# Patient Record
Sex: Female | Born: 1990
Health system: Southern US, Community
[De-identification: ages and names within clinical notes are randomized; demographics above are authoritative.]

## PROBLEM LIST (undated history)

## (undated) DIAGNOSIS — O26649 Intrahepatic cholestasis of pregnancy, unspecified trimester: Secondary | ICD-10-CM

## (undated) DIAGNOSIS — K8020 Calculus of gallbladder without cholecystitis without obstruction: Secondary | ICD-10-CM

## (undated) DIAGNOSIS — K831 Obstruction of bile duct: Secondary | ICD-10-CM

## (undated) DIAGNOSIS — O26619 Liver and biliary tract disorders in pregnancy, unspecified trimester: Secondary | ICD-10-CM

## (undated) HISTORY — PX: WISDOM TOOTH EXTRACTION: SHX21

## (undated) HISTORY — PX: TONSILLECTOMY: SUR1361

---

## 2015-02-22 NOTE — L&D Delivery Note (Signed)
Delivery Note At 4:41 AM a healthy female was delivered via Vaginal, Spontaneous Delivery (Presentation: Ant Occiput).  APGAR: 9, 9; weight pending.   Placenta status: complete, 3 Vs.  Cord:  with the following complications: none.  Cord pH: N/A  Anesthesia:  Epidural Episiotomy: None Lacerations: 2nd degree perineal Suture Repair: 2.0 3.0 vicryl vicryl rapide Est. Blood Loss (mL):    Mom to postpartum.  Baby to Couplet care / Skin to Skin. Circ consent obtained, will do Wednesday am.  Jocelynne Duquette,MARIE-LYNE 11/16/2015, 5:06 AM

## 2015-05-18 LAB — OB RESULTS CONSOLE HIV ANTIBODY (ROUTINE TESTING): HIV: NONREACTIVE

## 2015-05-18 LAB — OB RESULTS CONSOLE ABO/RH: RH TYPE: POSITIVE

## 2015-05-18 LAB — OB RESULTS CONSOLE RPR: RPR: NONREACTIVE

## 2015-05-18 LAB — OB RESULTS CONSOLE ANTIBODY SCREEN: ANTIBODY SCREEN: NEGATIVE

## 2015-05-18 LAB — OB RESULTS CONSOLE GC/CHLAMYDIA
Chlamydia: NEGATIVE
GC PROBE AMP, GENITAL: NEGATIVE

## 2015-05-18 LAB — OB RESULTS CONSOLE HEPATITIS B SURFACE ANTIGEN: Hepatitis B Surface Ag: NEGATIVE

## 2015-05-18 LAB — OB RESULTS CONSOLE RUBELLA ANTIBODY, IGM: RUBELLA: IMMUNE

## 2015-05-28 ENCOUNTER — Inpatient Hospital Stay (HOSPITAL_COMMUNITY): Admit: 2015-05-28 | Payer: Self-pay | Admitting: Obstetrics and Gynecology

## 2015-10-20 ENCOUNTER — Other Ambulatory Visit: Payer: Self-pay | Admitting: Obstetrics & Gynecology

## 2015-10-20 ENCOUNTER — Other Ambulatory Visit (HOSPITAL_COMMUNITY): Payer: Self-pay | Admitting: Obstetrics & Gynecology

## 2015-10-20 DIAGNOSIS — K831 Obstruction of bile duct: Secondary | ICD-10-CM

## 2015-10-20 DIAGNOSIS — R740 Nonspecific elevation of levels of transaminase and lactic acid dehydrogenase [LDH]: Secondary | ICD-10-CM

## 2015-10-20 DIAGNOSIS — O26643 Intrahepatic cholestasis of pregnancy, third trimester: Secondary | ICD-10-CM

## 2015-10-20 DIAGNOSIS — O26613 Liver and biliary tract disorders in pregnancy, third trimester: Principal | ICD-10-CM

## 2015-10-20 DIAGNOSIS — R7401 Elevation of levels of liver transaminase levels: Secondary | ICD-10-CM

## 2015-10-21 ENCOUNTER — Ambulatory Visit (HOSPITAL_COMMUNITY)
Admission: RE | Admit: 2015-10-21 | Discharge: 2015-10-21 | Disposition: A | Discharge status: Home / Self Care | Payer: Federal, State, Local not specified - PPO | Source: Ambulatory Visit | Attending: Obstetrics & Gynecology | Admitting: Obstetrics & Gynecology

## 2015-10-21 DIAGNOSIS — Z3A00 Weeks of gestation of pregnancy not specified: Secondary | ICD-10-CM | POA: Diagnosis not present

## 2015-10-21 DIAGNOSIS — O26613 Liver and biliary tract disorders in pregnancy, third trimester: Secondary | ICD-10-CM | POA: Diagnosis present

## 2015-10-21 DIAGNOSIS — K831 Obstruction of bile duct: Secondary | ICD-10-CM | POA: Diagnosis present

## 2015-10-28 ENCOUNTER — Encounter (HOSPITAL_COMMUNITY): Payer: Self-pay

## 2015-10-28 ENCOUNTER — Inpatient Hospital Stay (HOSPITAL_COMMUNITY): Payer: Federal, State, Local not specified - PPO

## 2015-10-28 ENCOUNTER — Observation Stay (HOSPITAL_COMMUNITY)
Admission: AD | Admit: 2015-10-28 | Discharge: 2015-10-28 | Disposition: A | Discharge status: Home / Self Care | Payer: Federal, State, Local not specified - PPO | Source: Ambulatory Visit | Attending: Obstetrics | Admitting: Obstetrics

## 2015-10-28 ENCOUNTER — Ambulatory Visit (HOSPITAL_COMMUNITY)
Admission: RE | Admit: 2015-10-28 | Discharge: 2015-10-28 | Disposition: A | Discharge status: Home / Self Care | Payer: Federal, State, Local not specified - PPO | Source: Ambulatory Visit | Attending: Obstetrics | Admitting: Obstetrics

## 2015-10-28 DIAGNOSIS — Z3A34 34 weeks gestation of pregnancy: Secondary | ICD-10-CM | POA: Insufficient documentation

## 2015-10-28 DIAGNOSIS — K831 Obstruction of bile duct: Secondary | ICD-10-CM

## 2015-10-28 DIAGNOSIS — R748 Abnormal levels of other serum enzymes: Secondary | ICD-10-CM

## 2015-10-28 DIAGNOSIS — O26613 Liver and biliary tract disorders in pregnancy, third trimester: Principal | ICD-10-CM

## 2015-10-28 LAB — CBC WITH DIFFERENTIAL/PLATELET
BASOS ABS: 0 10*3/uL (ref 0.0–0.1)
BASOS PCT: 0 %
Eosinophils Absolute: 0 10*3/uL (ref 0.0–0.7)
Eosinophils Relative: 0 %
HEMATOCRIT: 36.3 % (ref 36.0–46.0)
Hemoglobin: 12.8 g/dL (ref 12.0–15.0)
LYMPHS PCT: 9 %
Lymphs Abs: 0.9 10*3/uL (ref 0.7–4.0)
MCH: 27.5 pg (ref 26.0–34.0)
MCHC: 35.3 g/dL (ref 30.0–36.0)
MCV: 78.1 fL (ref 78.0–100.0)
MONO ABS: 0.1 10*3/uL (ref 0.1–1.0)
Monocytes Relative: 1 %
NEUTROS ABS: 9.4 10*3/uL — AB (ref 1.7–7.7)
Neutrophils Relative %: 90 %
PLATELETS: 221 10*3/uL (ref 150–400)
RBC: 4.65 MIL/uL (ref 3.87–5.11)
RDW: 14 % (ref 11.5–15.5)
WBC: 10.5 10*3/uL (ref 4.0–10.5)

## 2015-10-28 LAB — LIPASE, BLOOD: LIPASE: 25 U/L (ref 11–51)

## 2015-10-28 LAB — COMPREHENSIVE METABOLIC PANEL
ALBUMIN: 3.3 g/dL — AB (ref 3.5–5.0)
ALK PHOS: 174 U/L — AB (ref 38–126)
ALT: 232 U/L — ABNORMAL HIGH (ref 14–54)
ANION GAP: 9 (ref 5–15)
AST: 104 U/L — ABNORMAL HIGH (ref 15–41)
BILIRUBIN TOTAL: 0.7 mg/dL (ref 0.3–1.2)
BUN: 10 mg/dL (ref 6–20)
CALCIUM: 9.1 mg/dL (ref 8.9–10.3)
CO2: 18 mmol/L — ABNORMAL LOW (ref 22–32)
Chloride: 108 mmol/L (ref 101–111)
Creatinine, Ser: 0.6 mg/dL (ref 0.44–1.00)
Glucose, Bld: 91 mg/dL (ref 65–99)
POTASSIUM: 4 mmol/L (ref 3.5–5.1)
Sodium: 135 mmol/L (ref 135–145)
TOTAL PROTEIN: 7.1 g/dL (ref 6.5–8.1)

## 2015-10-28 LAB — OB RESULTS CONSOLE GBS: GBS: NEGATIVE

## 2015-10-28 LAB — URIC ACID: Uric Acid, Serum: 3.9 mg/dL (ref 2.3–6.6)

## 2015-10-28 LAB — TYPE AND SCREEN
ABO/RH(D): A POS
ANTIBODY SCREEN: NEGATIVE

## 2015-10-28 LAB — PROTIME-INR
INR: 0.88
Prothrombin Time: 11.9 seconds (ref 11.4–15.2)

## 2015-10-28 LAB — ABO/RH: ABO/RH(D): A POS

## 2015-10-28 MED ORDER — LACTATED RINGERS IV BOLUS (SEPSIS)
500.0000 mL | Freq: Once | INTRAVENOUS | Status: AC
  Administered 2015-10-28: 500 mL via INTRAVENOUS

## 2015-10-28 MED ORDER — ACETAMINOPHEN 325 MG PO TABS: 650.0000 mg | ORAL_TABLET | ORAL | Status: DC | PRN

## 2015-10-28 MED ORDER — DOCUSATE SODIUM 100 MG PO CAPS: 100.0000 mg | ORAL_CAPSULE | Freq: Every day | ORAL | Status: DC

## 2015-10-28 MED ORDER — URSODIOL 300 MG PO CAPS
300.0000 mg | ORAL_CAPSULE | Freq: Two times a day (BID) | ORAL | Status: DC
  Administered 2015-10-28: 300 mg via ORAL
  Filled 2015-10-28 (×3): qty 1

## 2015-10-28 MED ORDER — CALCIUM CARBONATE ANTACID 500 MG PO CHEW: 2.0000 | CHEWABLE_TABLET | ORAL | Status: DC | PRN

## 2015-10-28 MED ORDER — PRENATAL MULTIVITAMIN CH: 1.0000 | ORAL_TABLET | Freq: Every day | ORAL | Status: DC

## 2015-10-28 MED ORDER — URSODIOL 300 MG PO CAPS: 300.0000 mg | ORAL_CAPSULE | Freq: Two times a day (BID) | ORAL | 1 refills | Status: DC

## 2015-10-28 MED ORDER — ZOLPIDEM TARTRATE 5 MG PO TABS: 5.0000 mg | ORAL_TABLET | Freq: Every evening | ORAL | Status: DC | PRN

## 2015-10-28 NOTE — Progress Notes (Signed)
MFM consult, staff note:  I spoke with Stefanie Smith.  Intrahepatic Smith of pregnancy (ICP) affects 0.7% of white pregnant women, approximately twice as many [name] women, and up to 5% of [name] women. The recurrence rate of ICP varies from 60% to 90% in different populations.  Accordingly, she had a prior pregnancy affected by Smith of pregnancy.    Fetal complications that occur more commonly in ICP pregnancies include preterm labor, fetal asphyxial events, meconium staining of amniotic fluid, and intrauterine death. Three studies have demonstrated that ICP patients with higher maternal serum bile acid levels (>40 mol/L in two studies) more commonly have pregnancies complicated by meconium-stained liquor and fetal asphyxial events, and the largest study also demonstrated that patients with higher levels of bile acids had higher rates of spontaneous preterm labor.   ICP should be also in pregnant women with pruritus but without a rash and serum bile acid >10, noting this patient's bile acids were 21.569micromol/l. The pruritus is commonly generalized or affects the palms and soles, but it can occur on any part of the body. There is no consensus about the most reliable biochemical test for diagnosing ICP. Periotic measuring levels of liver transaminases with serum bile acids is recommended. Although ICP often identified during late pregnancy, early recurrent as also been reported. Smith may also occur in conjunction with other liver diseases. It is advisable to perform a liver ultrasound scan to exclude biliary obstruction. Affected women commonly have gallstones, however, the gallstones are unlikely to be the cause of the Smith unless the woman has symptoms of biliary obstruction. Other conditions that can be associated with ICP are hepatitis C, autoimmune hepatitis, and primary biliary cirrhosis. These conditions have important  implications for the subsequent health of the mother, and it is therefore advisable to screen for them.  Ursodeoxycholic acid (UDCA) is the only drug that has consistently been shown to improve the maternal symptoms and biochemical features of ICP. There have been several reports about the efficacy of UDCA in ICP, but there have been few randomized, controlled trials. The largest trial showed that UDCA reduced levels of pruritus, liver transaminases, and bilirubin compared with dexamethasone or placebo and that it was particularly effective in women with serum levels of bile acids higher than 40 mol/L. UDCA is usually started at a dose of 300 mg twice daily, and the dose may be increased further.  A variety of other drugs have been proposed as treatments for ICP, including dexamethasone, S-adenosyl methionine, cholestyramine, and guar gum, but there is less evidence for their efficacy than there is for UDCA.  No treatments have been shown to reduce fetal risks associated with ICP. However, it is likely that treatments that reduce levels of maternal bile acids also reduce fetal risk because of the data that implicate bile acids in pregnancies complicated by spontaneous preterm delivery, fetal asphyxial events, and meconium-stained amniotic fluid. None of the UDCA trials has been powered to investigate whether the drug protects the fetus. However, it is known that UDCA treatment improves the serum bile acid levels measured in cord blood and amniotic fluid at the time of delivery.  The only forms of fetal surveillance that have been shown to predict which fetuses may be at risk are amniocentesis and amnioscopy for meconium. However, such an approach is likely to be considered too intrusive to be used routinely by most obstetricians. Many obstetric units review women with ICP several times per week for fetal  assessment by electronic fetal monitoring and/or biophysical profile, or both.  I recommend twice weekly  NST, weekly AFI, monthly interval growth, and regular prenatal visits with plan for delivery at 37 weeks.  Kick counts in the interim.  She had a negative viral hepatitis panel and essentially normal RUQ ultrasound for pregnancy.  Given her AST of 285, ALT of 521, I was concerned that she could have an evolving AFLP (acute fatty liver of pregnancy).  However, upon admission today, her serum glucose is normal and she has no jaundice/icterus, effectively ruling out AFLP.  Additionally, her LFTs have improved now at AST 104 and ALT 232 with glucose 91 and Cr 0.6.  It is possible she may have some underlying autoimmune hepatitis, and I would recommend outpatient workup with GI medicine but this is not emergent.    Recommendations: 1. Ursodiol 300mg  po bid (would continue until ~1wk postpartum as the bile acids may remain elevated and create pruritus for several days postpartum 2. NST 2x/wk 3. AFI weekly  4. Interval growth by u/s monthly 5. Consider outpatient GI medicine referral to r/o autoimmune hepatitis if they feel it's necessary noting I am less concerned given the improvement but wish to maintain the recommendation given that it is atypical to have LFT's of 285/521 with Smith of pregnancy without some underlying comorbid predisposition (noting I have seen this presentation previously and the patient did have both Smith and had a positive w/u for autoimmune hepatitis). 6. Delivery at 36-37 weeks 7. Close surveillance for development of preeclampsia (increased with Smith) 8. May use benadryl or hydroxyzine for itching as well   Greater than 50% of the 40 minute visit was spent in counseling/coordination of care for the patient regarding above.   I saw your patient in consultation.  Please see attached impression and recommendations.  Thank you,  Louann Sjogren Gaynelle Arabian, Louann Sjogren, MD, MS, FACOG Assistant Professor Section of Maternal-Fetal Medicine [facility]

## 2015-10-28 NOTE — Progress Notes (Signed)
Notified MD that patient has arrived, received orders.  Called lab to pull stat lab orders.

## 2015-10-28 NOTE — H&P (Signed)
Stefanie FreshwaterMichelle Smith is a 25 y.o. G2P1 at 1334 wks presenting for further evaluation given elevated LFTs. Patient was seen in the office today for routine OB visit. Patient was noting continued itching. Review of patient's chart revealed a diagnosis of cholestasis of pregnancy as she had had in her prior pregnancy. This was diagnosed on 10/02/2015. At that time her bile acid salts were 15.8 her AST was 58 and her ALT was 107. She had no additional symptoms of preeclampsia. She was seen back in the office on 8/25 with an ultrasound showing fetal growth at the 83rd percentile in the vertex position. Her AST was 200 her ALT was 404 and her bile acid salts was 29.9. Again she had no symptoms of preeclampsia.. Hepatitis workup was negative Patient had a right upper quadrant ultrasound on 8/30 which was normal other than some mild gallbladder wall thickening. She was seen back in the office on 8/31 and had a biophysical profile of 8 out of 8. Repeat labs on 8/31 revealed AST of 285 and ALT of 521. Upon her visit today these labs were noted. She was thus sent to the hospital after sending a vaginal culture for group B strep and administering a single dose of betamethasone. Pt notes no contractions . Good fetal movement, No vaginal bleeding, no leaking fluid. No headache or visual change. No right upper quadrant pain. No heartburn or emesis. Patient notes continued itching. She has not been placed on any medications for her itching.  [facility] - History of cholestasis with induction of labor 37 weeks in her prior pregnancy. Repeat diagnosis of cholestasis in this pregnancy. As noted above. - Posterior placenta previa which resolved to low-lying which are not resolved completely   Prenatal Transfer Tool  Maternal Diabetes: No Genetic Screening: Declined Maternal Ultrasounds/Referrals: Normal Fetal Ultrasounds or other Referrals:  None Maternal Substance Abuse:  No Significant Maternal  Medications:  None Significant Maternal Lab Results: Lab values include: Other:  elevated LFTs, elevated bile acid salts     OB History    Gravida Para Term Preterm AB Living   2 1       1    SAB TAB Ectopic Multiple Live Births                 History reviewed. No pertinent past medical history. History reviewed. No pertinent surgical history. Family History: family history is not on file. Social History:  has no tobacco, alcohol, and drug history on file.  Review of Systems - Negative except Itching     Blood pressure 122/73, pulse (!) 103, temperature 98.1 F (36.7 C), temperature source Oral, resp. rate 20, height 5\' 6"  (1.676 m), weight 83.5 kg (184 lb), SpO2 99 %.  Physical Exam:  Gen: well appearing, no distress CV: RRR Pulm: CTAB Back: no CVAT Abd: gravid, NT, no RUQ pain LE: No edema, equal bilaterally, non-tender. 2+ DTRs, no clonus Toco: None FH: baseline 140s, accelerations present, no deceleratons, 10 beat variability  Prenatal labs: ABO, Rh: --/--/A POS, A POS (09/06 1328) Antibody: NEG (09/06 1328) Rubella: !Error! Immune RPR:   nonreactive HBsAg:   negative HIV:   negative GBS:   not yet resulted 1 hr Glucola 113  Genetic screening declined, normal AFP Anatomy US normal  CBC    Component Value Date/Time   WBC 10.5 10/28/2015 1328   RBC 4.65 10/28/2015 1328   HGB 12.8 10/28/2015 1328   HCT 36.3 10/28/2015 1328  PLT 221 10/28/2015 1328   MCV 78.1 10/28/2015 1328   MCH 27.5 10/28/2015 1328   MCHC 35.3 10/28/2015 1328   RDW 14.0 10/28/2015 1328   LYMPHSABS 0.9 10/28/2015 1328   MONOABS 0.1 10/28/2015 1328   EOSABS 0.0 10/28/2015 1328   BASOSABS 0.0 10/28/2015 1328    CMP     Component Value Date/Time   NA 135 10/28/2015 1328   K 4.0 10/28/2015 1328   CL 108 10/28/2015 1328   CO2 18 (L) 10/28/2015 1328   GLUCOSE 91 10/28/2015 1328   BUN 10 10/28/2015 1328   CREATININE 0.60 10/28/2015 1328   CALCIUM 9.1 10/28/2015 1328   PROT 7.1  10/28/2015 1328   ALBUMIN 3.3 (L) 10/28/2015 1328   AST 104 (H) 10/28/2015 1328   ALT 232 (H) 10/28/2015 1328   ALKPHOS 174 (H) 10/28/2015 1328   BILITOT 0.7 10/28/2015 1328   GFRNONAA >60 10/28/2015 1328   GFRAA >60 10/28/2015 1328      Assessment/Plan: 25 y.o. G2P1 at 34 weeks with cholestasis of pregnancy and elevated LFTs Cholestasis of pregnancy. Will start on ursodiol. Symptomatic control as needed. Will plan twice weekly fetal evaluation, growth ultrasound every 3 weeks and plan for delivery at 36-37 weeks. Patient is aware of the risks of cholestasis of pregnancy and will continue close evaluation for symptoms of preeclampsia. Will recommend daily fetal kick counts - Elevated LFTs. LFT elevation is higher than would be expected for cholestasis alone. Unclear etiology but LFTs are trending back down. Right upper quadrant ultrasound has been negative on August 30. Patient is outpatient follow-up with GI. Appreciate their input and any additional testing. No evidence that this is from acute fatty liver of pregnancy or preeclampsia at this time. -GBS pending -Fetal well being. Reassuring testing at this point. Continue outpatient evaluation. Given plan for early delivery patient has received single dose of betamethasone and will have a second dose of betamethasone tomorrow.  Stefanie Smith A. 10/28/2015 5:56 PM      Stefanie Smith A. 10/28/2015, 5:45 PM

## 2015-10-28 NOTE — Progress Notes (Signed)
[facility]

## 2015-10-28 NOTE — Progress Notes (Signed)
Dr. Ernestina PennaFogleman at bedside, aware of contraction pattern.  Bolus ordered, sterile exam performed.   Pt. Has GI appointment in morning.

## 2015-10-29 ENCOUNTER — Other Ambulatory Visit (HOSPITAL_COMMUNITY): Payer: Self-pay

## 2015-10-29 ENCOUNTER — Encounter (HOSPITAL_COMMUNITY): Payer: Self-pay

## 2015-11-01 NOTE — Discharge Summary (Signed)
Patient ID: Stefanie FreshwaterMichelle Smith MRN: [id number] DOB/AGE: 27/05/1990 27 y.o.  Admit date: 10/28/2015 Discharge date: 10/28/15  Admission Diagnoses: 34wks elevated liver enzyme  Discharge Diagnoses: 34wks elevated liver enzyme         Discharged Condition: stable  Hospital Course: Pt admitted for further evaluation after office labs revealed very high LTFs in the setting of cholestasis of pregnancy Pt has serial bp's, fetal monitoring, MFM consult and repeat labs. She remained asymptomatic. Repeat LFTs were much lower, bp stable, serum glucose normal and reactive fetal testing. She was started or ursodiol and d/c home with GI follow up the day after d/c and office f/u the day after d/c for BMZ #2.   Consults: MFM  Treatments: IV hydration and ursodiol  Disposition: home     Medication List    TAKE these medications   HYDROCORTISONE EX Apply 1 application topically as needed (itching).   prenatal multivitamin Tabs tablet Take 1 tablet by mouth daily at 12 noon.   ursodiol 300 MG capsule Commonly known as:  ACTIGALL Take 1 capsule (300 mg total) by mouth 2 (two) times daily.        Signed: Lendon ColonelFOGLEMAN,Laurinda Carreno A., MD MD 11/01/2015, 9:42 PM

## 2015-11-06 ENCOUNTER — Encounter (HOSPITAL_COMMUNITY): Payer: Self-pay | Admitting: *Deleted

## 2015-11-06 ENCOUNTER — Inpatient Hospital Stay (HOSPITAL_COMMUNITY): Payer: Federal, State, Local not specified - PPO

## 2015-11-06 ENCOUNTER — Inpatient Hospital Stay (HOSPITAL_COMMUNITY)
Admission: AD | Admit: 2015-11-06 | Discharge: 2015-11-06 | Disposition: A | Discharge status: Home / Self Care | Payer: Federal, State, Local not specified - PPO | Source: Ambulatory Visit | Attending: Obstetrics and Gynecology | Admitting: Obstetrics and Gynecology

## 2015-11-06 DIAGNOSIS — Z3A35 35 weeks gestation of pregnancy: Secondary | ICD-10-CM | POA: Diagnosis not present

## 2015-11-06 DIAGNOSIS — O26613 Liver and biliary tract disorders in pregnancy, third trimester: Secondary | ICD-10-CM | POA: Insufficient documentation

## 2015-11-06 HISTORY — DX: Calculus of gallbladder without cholecystitis without obstruction: K80.20

## 2015-11-06 NOTE — MAU Note (Signed)
Pt states she was sent over for U/S from Dr Sharol RousselLavoie's office today. Denies any vaginal bleeding or LOF.

## 2015-11-06 NOTE — Discharge Instructions (Signed)
Fetal Movement Counts  Patient Name: __________________________________________________ Patient Due Date: ____________________  Performing a fetal movement count is highly recommended in high-risk pregnancies, but it is good for every pregnant woman to do. Your health care provider may ask you to start counting fetal movements at 28 weeks of the pregnancy. Fetal movements often increase:  · After eating a full meal.  · After physical activity.  · After eating or drinking something sweet or cold.  · At rest.  Pay attention to when you feel the baby is most active. This will help you notice a pattern of your baby's sleep and wake cycles and what factors contribute to an increase in fetal movement. It is important to perform a fetal movement count at the same time each day when your baby is normally most active.   HOW TO COUNT FETAL MOVEMENTS  1. Find a quiet and comfortable area to sit or lie down on your left side. Lying on your left side provides the best blood and oxygen circulation to your baby.  2. Write down the day and time on a sheet of paper or in a journal.  3. Start counting kicks, flutters, swishes, rolls, or jabs in a 2-hour period. You should feel at least 10 movements within 2 hours.  4. If you do not feel 10 movements in 2 hours, wait 2-3 hours and count again. Look for a change in the pattern or not enough counts in 2 hours.  SEEK MEDICAL CARE IF:  · You feel less than 10 counts in 2 hours, tried twice.  · There is no movement in over an hour.  · The pattern is changing or taking longer each day to reach 10 counts in 2 hours.  · You feel the baby is not moving as he or she usually does.  Date: ____________ Movements: ____________ Start time: ____________ Finish time: ____________   Date: ____________ Movements: ____________ Start time: ____________ Finish time: ____________  Date: ____________ Movements: ____________ Start time: ____________ Finish time: ____________  Date: ____________ Movements:  ____________ Start time: ____________ Finish time: ____________  Date: ____________ Movements: ____________ Start time: ____________ Finish time: ____________  Date: ____________ Movements: ____________ Start time: ____________ Finish time: ____________  Date: ____________ Movements: ____________ Start time: ____________ Finish time: ____________  Date: ____________ Movements: ____________ Start time: ____________ Finish time: ____________   Date: ____________ Movements: ____________ Start time: ____________ Finish time: ____________  Date: ____________ Movements: ____________ Start time: ____________ Finish time: ____________  Date: ____________ Movements: ____________ Start time: ____________ Finish time: ____________  Date: ____________ Movements: ____________ Start time: ____________ Finish time: ____________  Date: ____________ Movements: ____________ Start time: ____________ Finish time: ____________  Date: ____________ Movements: ____________ Start time: ____________ Finish time: ____________  Date: ____________ Movements: ____________ Start time: ____________ Finish time: ____________   Date: ____________ Movements: ____________ Start time: ____________ Finish time: ____________  Date: ____________ Movements: ____________ Start time: ____________ Finish time: ____________  Date: ____________ Movements: ____________ Start time: ____________ Finish time: ____________  Date: ____________ Movements: ____________ Start time: ____________ Finish time: ____________  Date: ____________ Movements: ____________ Start time: ____________ Finish time: ____________  Date: ____________ Movements: ____________ Start time: ____________ Finish time: ____________  Date: ____________ Movements: ____________ Start time: ____________ Finish time: ____________   Date: ____________ Movements: ____________ Start time: ____________ Finish time: ____________  Date: ____________ Movements: ____________ Start time: ____________ Finish  time: ____________  Date: ____________ Movements: ____________ Start time: ____________ Finish time: ____________  Date: ____________ Movements: ____________ Start time:   ____________ Finish time: ____________  Date: ____________ Movements: ____________ Start time: ____________ Finish time: ____________  Date: ____________ Movements: ____________ Start time: ____________ Finish time: ____________  Date: ____________ Movements: ____________ Start time: ____________ Finish time: ____________   Date: ____________ Movements: ____________ Start time: ____________ Finish time: ____________  Date: ____________ Movements: ____________ Start time: ____________ Finish time: ____________  Date: ____________ Movements: ____________ Start time: ____________ Finish time: ____________  Date: ____________ Movements: ____________ Start time: ____________ Finish time: ____________  Date: ____________ Movements: ____________ Start time: ____________ Finish time: ____________  Date: ____________ Movements: ____________ Start time: ____________ Finish time: ____________  Date: ____________ Movements: ____________ Start time: ____________ Finish time: ____________   Date: ____________ Movements: ____________ Start time: ____________ Finish time: ____________  Date: ____________ Movements: ____________ Start time: ____________ Finish time: ____________  Date: ____________ Movements: ____________ Start time: ____________ Finish time: ____________  Date: ____________ Movements: ____________ Start time: ____________ Finish time: ____________  Date: ____________ Movements: ____________ Start time: ____________ Finish time: ____________  Date: ____________ Movements: ____________ Start time: ____________ Finish time: ____________  Date: ____________ Movements: ____________ Start time: ____________ Finish time: ____________   Date: ____________ Movements: ____________ Start time: ____________ Finish time: ____________  Date: ____________  Movements: ____________ Start time: ____________ Finish time: ____________  Date: ____________ Movements: ____________ Start time: ____________ Finish time: ____________  Date: ____________ Movements: ____________ Start time: ____________ Finish time: ____________  Date: ____________ Movements: ____________ Start time: ____________ Finish time: ____________  Date: ____________ Movements: ____________ Start time: ____________ Finish time: ____________  Date: ____________ Movements: ____________ Start time: ____________ Finish time: ____________   Date: ____________ Movements: ____________ Start time: ____________ Finish time: ____________  Date: ____________ Movements: ____________ Start time: ____________ Finish time: ____________  Date: ____________ Movements: ____________ Start time: ____________ Finish time: ____________  Date: ____________ Movements: ____________ Start time: ____________ Finish time: ____________  Date: ____________ Movements: ____________ Start time: ____________ Finish time: ____________  Date: ____________ Movements: ____________ Start time: ____________ Finish time: ____________     This information is not intended to replace advice given to you by your health care provider. Make sure you discuss any questions you have with your health care provider.     Document Released: 03/09/2006 Document Revised: 02/28/2014 Document Reviewed: 12/05/2011  Elsevier Interactive Patient Education ©2016 [employer]

## 2015-11-09 ENCOUNTER — Other Ambulatory Visit (HOSPITAL_COMMUNITY): Payer: Self-pay | Admitting: Obstetrics & Gynecology

## 2015-11-09 DIAGNOSIS — O288 Other abnormal findings on antenatal screening of mother: Secondary | ICD-10-CM

## 2015-11-09 DIAGNOSIS — Z3A36 36 weeks gestation of pregnancy: Secondary | ICD-10-CM

## 2015-11-10 ENCOUNTER — Encounter (HOSPITAL_COMMUNITY): Payer: Self-pay | Admitting: *Deleted

## 2015-11-10 ENCOUNTER — Telehealth (HOSPITAL_COMMUNITY): Payer: Self-pay | Admitting: *Deleted

## 2015-11-10 ENCOUNTER — Ambulatory Visit (HOSPITAL_COMMUNITY): Payer: Federal, State, Local not specified - PPO

## 2015-11-10 ENCOUNTER — Encounter (HOSPITAL_COMMUNITY): Payer: Self-pay

## 2015-11-10 NOTE — Telephone Encounter (Signed)
Preadmission screen  

## 2015-11-13 ENCOUNTER — Other Ambulatory Visit: Payer: Self-pay | Admitting: Obstetrics & Gynecology

## 2015-11-15 ENCOUNTER — Inpatient Hospital Stay (HOSPITAL_COMMUNITY): Payer: Federal, State, Local not specified - PPO | Admitting: Anesthesiology

## 2015-11-15 ENCOUNTER — Inpatient Hospital Stay (HOSPITAL_COMMUNITY)
Admission: AD | Admit: 2015-11-15 | Discharge: 2015-11-18 | DRG: 775 | Disposition: A | Discharge status: Home / Self Care | Payer: Federal, State, Local not specified - PPO | Source: Ambulatory Visit | Attending: Obstetrics & Gynecology | Admitting: Obstetrics & Gynecology

## 2015-11-15 ENCOUNTER — Inpatient Hospital Stay (HOSPITAL_COMMUNITY): Admission: RE | Admit: 2015-11-15 | Payer: Federal, State, Local not specified - PPO | Source: Ambulatory Visit

## 2015-11-15 DIAGNOSIS — O2662 Liver and biliary tract disorders in childbirth: Principal | ICD-10-CM | POA: Diagnosis present

## 2015-11-15 DIAGNOSIS — K831 Obstruction of bile duct: Secondary | ICD-10-CM | POA: Diagnosis present

## 2015-11-15 DIAGNOSIS — Z3A36 36 weeks gestation of pregnancy: Secondary | ICD-10-CM

## 2015-11-15 LAB — COMPREHENSIVE METABOLIC PANEL
ALK PHOS: 143 U/L — AB (ref 38–126)
ALT: 24 U/L (ref 14–54)
AST: 21 U/L (ref 15–41)
Albumin: 3.4 g/dL — ABNORMAL LOW (ref 3.5–5.0)
Anion gap: 8 (ref 5–15)
BUN: 9 mg/dL (ref 6–20)
CALCIUM: 8.9 mg/dL (ref 8.9–10.3)
CO2: 20 mmol/L — AB (ref 22–32)
CREATININE: 0.55 mg/dL (ref 0.44–1.00)
Chloride: 107 mmol/L (ref 101–111)
GFR calc non Af Amer: >60 mL/min (ref >60)
GLUCOSE: 79 mg/dL (ref 65–99)
Potassium: 3.9 mmol/L (ref 3.5–5.1)
SODIUM: 135 mmol/L (ref 135–145)
Total Bilirubin: 0.3 mg/dL (ref 0.3–1.2)
Total Protein: 6.7 g/dL (ref 6.5–8.1)

## 2015-11-15 LAB — TYPE AND SCREEN
ABO/RH(D): A POS
Antibody Screen: NEGATIVE

## 2015-11-15 LAB — CBC
HCT: 34.3 % — ABNORMAL LOW (ref 36.0–46.0)
Hemoglobin: 12 g/dL (ref 12.0–15.0)
MCH: 27.5 pg (ref 26.0–34.0)
MCHC: 35 g/dL (ref 30.0–36.0)
MCV: 78.5 fL (ref 78.0–100.0)
PLATELETS: 220 10*3/uL (ref 150–400)
RBC: 4.37 MIL/uL (ref 3.87–5.11)
RDW: 13.9 % (ref 11.5–15.5)
WBC: 9.7 10*3/uL (ref 4.0–10.5)

## 2015-11-15 LAB — RPR: RPR Ser Ql: NONREACTIVE

## 2015-11-15 MED ORDER — LACTATED RINGERS IV SOLN: 500.0000 mL | Freq: Once | INTRAVENOUS | Status: DC

## 2015-11-15 MED ORDER — OXYTOCIN 40 UNITS IN LACTATED RINGERS INFUSION - SIMPLE MED: 2.5000 [IU]/h | INTRAVENOUS | Status: DC

## 2015-11-15 MED ORDER — SOD CITRATE-CITRIC ACID 500-334 MG/5ML PO SOLN: 30.0000 mL | ORAL | Status: DC | PRN

## 2015-11-15 MED ORDER — FENTANYL 2.5 MCG/ML BUPIVACAINE 1/10 % EPIDURAL INFUSION (WH - ANES)
INTRAMUSCULAR | Status: AC
  Filled 2015-11-15: qty 125

## 2015-11-15 MED ORDER — OXYCODONE-ACETAMINOPHEN 5-325 MG PO TABS: 1.0000 | ORAL_TABLET | ORAL | Status: DC | PRN

## 2015-11-15 MED ORDER — ONDANSETRON HCL 4 MG/2ML IJ SOLN: 4.0000 mg | Freq: Four times a day (QID) | INTRAMUSCULAR | Status: DC | PRN

## 2015-11-15 MED ORDER — EPHEDRINE 5 MG/ML INJ
10.0000 mg | INTRAVENOUS | Status: DC | PRN
  Filled 2015-11-15: qty 4

## 2015-11-15 MED ORDER — MISOPROSTOL 25 MCG QUARTER TABLET
25.0000 ug | ORAL_TABLET | ORAL | Status: DC
  Administered 2015-11-15: 25 ug via VAGINAL
  Filled 2015-11-15: qty 1
  Filled 2015-11-15: qty 0.25
  Filled 2015-11-15: qty 1

## 2015-11-15 MED ORDER — TERBUTALINE SULFATE 1 MG/ML IJ SOLN
0.2500 mg | Freq: Once | INTRAMUSCULAR | Status: DC | PRN
  Filled 2015-11-15: qty 1

## 2015-11-15 MED ORDER — DIPHENHYDRAMINE HCL 50 MG/ML IJ SOLN: 12.5000 mg | INTRAMUSCULAR | Status: DC | PRN

## 2015-11-15 MED ORDER — ACETAMINOPHEN 325 MG PO TABS: 650.0000 mg | ORAL_TABLET | ORAL | Status: DC | PRN

## 2015-11-15 MED ORDER — LACTATED RINGERS IV SOLN
INTRAVENOUS | Status: DC
  Administered 2015-11-15 – 2015-11-16 (×3): via INTRAVENOUS

## 2015-11-15 MED ORDER — OXYTOCIN BOLUS FROM INFUSION: 500.0000 mL | Freq: Once | INTRAVENOUS | Status: DC

## 2015-11-15 MED ORDER — PHENYLEPHRINE 40 MCG/ML (10ML) SYRINGE FOR IV PUSH (FOR BLOOD PRESSURE SUPPORT)
PREFILLED_SYRINGE | INTRAVENOUS | Status: AC
  Filled 2015-11-15: qty 20

## 2015-11-15 MED ORDER — LIDOCAINE HCL (PF) 1 % IJ SOLN
30.0000 mL | INTRAMUSCULAR | Status: DC | PRN
  Filled 2015-11-15: qty 30

## 2015-11-15 MED ORDER — LACTATED RINGERS IV SOLN: 500.0000 mL | INTRAVENOUS | Status: DC | PRN

## 2015-11-15 MED ORDER — OXYCODONE-ACETAMINOPHEN 5-325 MG PO TABS: 2.0000 | ORAL_TABLET | ORAL | Status: DC | PRN

## 2015-11-15 MED ORDER — PHENYLEPHRINE 40 MCG/ML (10ML) SYRINGE FOR IV PUSH (FOR BLOOD PRESSURE SUPPORT)
80.0000 ug | PREFILLED_SYRINGE | INTRAVENOUS | Status: DC | PRN
  Filled 2015-11-15: qty 5

## 2015-11-15 MED ORDER — FENTANYL 2.5 MCG/ML BUPIVACAINE 1/10 % EPIDURAL INFUSION (WH - ANES)
16.0000 mL/h | INTRAMUSCULAR | Status: DC | PRN
  Administered 2015-11-15: 14 mL/h via EPIDURAL

## 2015-11-15 MED ORDER — LIDOCAINE HCL (PF) 1 % IJ SOLN
INTRAMUSCULAR | Status: DC | PRN
  Administered 2015-11-15 (×2): 4 mL

## 2015-11-15 MED ORDER — OXYTOCIN 40 UNITS IN LACTATED RINGERS INFUSION - SIMPLE MED
1.0000 m[IU]/min | INTRAVENOUS | Status: DC
  Administered 2015-11-15: 2 m[IU]/min via INTRAVENOUS
  Filled 2015-11-15: qty 1000

## 2015-11-15 NOTE — Progress Notes (Signed)
RN Called Dr Ernestina PennaFogleman to update with cervical exam, since it is time for either another cytotec or Pitocin. Cervix unchanged, except that it has softened significantly since last exam. Dr Ernestina PennaFogleman okay'ed the administration of Pitocin, since patient is contracting too much to give another cytotec.   Patient requests to eat a light laboring lunch prior to beginning Pitocin. RN agreed, since it had already been ordered and is on its way.   Patient will call RN as soon as she finishes meal.   Loetta RoughAmber Brown Feliciana Narayan, RN 11/15/2015 1:43 PM

## 2015-11-15 NOTE — Anesthesia Procedure Notes (Signed)
Epidural Patient location during procedure: OB  Staffing Anesthesiologist: Roslin Norwood Performed: anesthesiologist   Preanesthetic Checklist Completed: patient identified, site marked, surgical consent, pre-op evaluation, timeout performed, IV checked, risks and benefits discussed and monitors and equipment checked  Epidural Patient position: sitting Prep: site prepped and draped and DuraPrep Patient monitoring: continuous pulse ox and blood pressure Approach: midline Location: L3-L4 Injection technique: LOR saline  Needle:  Needle type: Tuohy  Needle gauge: 17 G Needle length: 9 cm and 9 Needle insertion depth: 5 cm cm Catheter type: closed end flexible Catheter size: 19 Gauge Catheter at skin depth: 10 cm Test dose: negative  Assessment Events: blood not aspirated, injection not painful, no injection resistance, negative IV test and no paresthesia  Additional Notes Patient identified. Risks/Benefits/Options discussed with patient including but not limited to bleeding, infection, nerve damage, paralysis, failed block, incomplete pain control, headache, blood pressure changes, nausea, vomiting, reactions to medication both or allergic, itching and postpartum back pain. Confirmed with bedside nurse the patient's most recent platelet count. Confirmed with patient that they are not currently taking any anticoagulation, have any bleeding history or any family history of bleeding disorders. Patient expressed understanding and wished to proceed. All questions were answered. Sterile technique was used throughout the entire procedure. Please see nursing notes for vital signs. Test dose was given through epidural catheter and negative prior to continuing to dose epidural or start infusion. Warning signs of high block given to the patient including shortness of breath, tingling/numbness in hands, complete motor block, or any concerning symptoms with instructions to call for help. Patient was  given instructions on fall risk and not to get out of bed. All questions and concerns addressed with instructions to call with any issues or inadequate analgesia.        

## 2015-11-15 NOTE — Anesthesia Preprocedure Evaluation (Signed)
Anesthesia Evaluation  Patient identified by MRN, date of birth, ID band Patient awake    Reviewed: Allergy & Precautions, NPO status , Patient's Chart, lab work & pertinent test results  History of Anesthesia Complications Negative for: history of anesthetic complications  Airway Mallampati: II  TM Distance: >3 FB Neck ROM: Full    Dental no notable dental hx. (+) Dental Advisory Given   Pulmonary neg pulmonary ROS,    Pulmonary exam normal breath sounds clear to auscultation       Cardiovascular negative cardio ROS Normal cardiovascular exam Rhythm:Regular Rate:Normal     Neuro/Psych negative neurological ROS  negative psych ROS   GI/Hepatic negative GI ROS, Neg liver ROS,   Endo/Other  negative endocrine ROS  Renal/GU negative Renal ROS  negative genitourinary   Musculoskeletal negative musculoskeletal ROS (+)   Abdominal   Peds negative pediatric ROS (+)  Hematology negative hematology ROS (+)   Anesthesia Other Findings   Reproductive/Obstetrics (+) Pregnancy                             Anesthesia Physical Anesthesia Plan  ASA: II  Anesthesia Plan: Epidural   Post-op Pain Management:    Induction:   Airway Management Planned:   Additional Equipment:   Intra-op Plan:   Post-operative Plan:   Informed Consent: I have reviewed the patients History and Physical, chart, labs and discussed the procedure including the risks, benefits and alternatives for the proposed anesthesia with the patient or authorized representative who has indicated his/her understanding and acceptance.   Dental advisory given  Plan Discussed with: CRNA  Anesthesia Plan Comments:         Anesthesia Quick Evaluation  

## 2015-11-15 NOTE — H&P (Addendum)
Ronnell FreshwaterMichelle Rattan is a 25 y.o. female G2P1 7099w4d presenting for Cholestasis of Pregnancy for Induction.  HPP/HPI:  Cholestasis of Pregnancy in G1 and again with this pregnancy.  Sxic since 28th wk.  Bile Acid 21.9.  AST/ALT also elevated.  Seen by MFM 9/6th.  Started on Ursodiol.  Fetal well-being testing twice a week reassuring.  Upper AGA per US.  BMethasone x2 received at 34+ wks.     OB History    Gravida Para Term Preterm AB Living   2 1       1    SAB TAB Ectopic Multiple Live Births                 Past Medical History:  Diagnosis Date  . Cholelithiases    pregnancy   Past Surgical History:  Procedure Laterality Date  . APPENDECTOMY    . TONSILLECTOMY    . WISDOM TOOTH EXTRACTION     Family History: family history is not on file. Social History:  reports that she has never smoked. She has never used smokeless tobacco. She reports that she does not drink alcohol or use drugs.   Allergies  Allergen Reactions  . Amoxicillin Hives    Has patient had a PCN reaction causing immediate rash, facial/tongue/throat swelling, SOB or lightheadedness with hypotension: Yes Has patient had a PCN reaction causing severe rash involving mucus membranes or skin necrosis: No Has patient had a PCN reaction that required hospitalization No Has patient had a PCN reaction occurring within the last 10 years: No If all of the above answers are "NO", then may proceed with Cephalosporin use.       Last menstrual period 03/04/2015.   Exam Physical Exam   Temp (F)   98  98 (36.7)  09/24 0754  Pulse Rate   104  104  09/24 0755  Resp   16  16  09/24 0755  BP   117/78  117/78  09/24 0755  Weight (lb)   183  183 lb (83 kg)  09/24 0755   Temp (F)   98  98 (36.7)  09/24 0754  Pulse Rate   104  104  09/24 0755  Resp   16  16  09/24 0755  BP   117/78  117/78  09/24 0755  Weight (lb)   183  183 lb (83 kg)  09/24 0755    FHR monitoring 140's with good variability, accelerations  present.  No deceleration. Mild irregular UC.  VE pending.   HPP:  Patient Active Problem List   Diagnosis Date Noted  . Cholestasis of pregnancy in third trimester 10/28/2015    Prenatal labs: ABO, Rh: --/--/A POS, A POS (09/06 1328) Antibody: NEG (09/06 1328) Rubella: Immune RPR: Nonreactive (03/27 0000)  HBsAg: Negative (03/27 0000)  HIV: Non-reactive (03/27 0000)  Genetic testing: Declined.  AFP1 neg. US anato: wnl, RVOT suboptimal.  Placenta low lying, resolved in 3rd trimester. 1 hr GTT: wnl GBS: Negative (09/06 0000)   Assessment/Plan: 36 4/7 wks with Cholestasis of Pregnancy for Induction.  Cytotec/Pitocin.  Continuous monitoring, Cat 1 currently.  Repeat CMP/Bile Acid/CBC.  Dr Ernestina PennaFogleman will follow until 5 pm, I will take over at 5 pm.   Peggye Poon,MARIE-LYNE 11/15/2015, 7:40 AM

## 2015-11-15 NOTE — Progress Notes (Signed)
S: Doing well, no complaints, pain well controlled at this time while undergoing cervical ripening. No LOF, no VB, some cramping, good FM. No HA, no RUQ pain. Itching much improved on Ursodiol.   O: BP 117/78 (BP Location: Left Arm)   Pulse (!) 104   Temp 98 F (36.7 C)   Resp 16   Ht 5\' 6"  (1.676 m)   Wt 83 kg (183 lb)   LMP 03/04/2015   BMI 29.54 kg/m    FHT:  FHR: 140s bpm, variability: moderate,  accelerations:  Present,  decelerations:  Absent UC:   irritability SVE:   Dilation: 1 Effacement (%): 20 Station: Ballotable Exam by:: midd   A / P:  25 y.o.  Obstetric History   G2   P1   T0   P0   A0   L1    SAB0   TAB0   Ectopic0   Multiple0   Live Births0    at 4620w4d IOL for cholestasis  cytotec now, likely to repeat x 1 then pitocin Pt aware R/B of IOL vs continued preg and fetal risks given cholestasis.   Fetal Wellbeing: reactive Pain Control:  Labor support without medications  Anticipated MOD:  NSVD  Atul Delucia A. 11/15/2015, 12:04 PM

## 2015-11-15 NOTE — Anesthesia Pain Management Evaluation Note (Signed)
  CRNA Pain Management Visit Note  Patient: Stefanie Smith, 25 y.o., female  "Hello I am a member of the anesthesia team at [facility]. We have an anesthesia team available at all times to provide care throughout the hospital, including epidural management and anesthesia for C-section. I don't know your plan for the delivery whether it a natural birth, water birth, IV sedation, nitrous supplementation, doula or epidural, but we want to meet your pain goals."   1.Was your pain managed to your expectations on prior hospitalizations?   Yes   2.What is your expectation for pain management during this hospitalization?     Epidural  3.How can we help you reach that goal? Support prn  Record the patient's initial score and the patient's pain goal.   Pain: 0  Pain Goal: 4 [facility] wants you to be able to say your pain was always managed very well.  [facility],Kadajah Kjos 11/15/2015

## 2015-11-15 NOTE — Progress Notes (Signed)
Subjective: Doing well, pain mild-mod, UCs q2-3 min  Anesthesia none   Objective: BP 105/67   Pulse 62   Temp 98.2 F (36.8 C) (Oral)   Resp 18   Ht 5\' 6"  (1.676 m)   Wt 183 lb (83 kg)   LMP 03/04/2015   BMI 29.54 kg/m    FHT:  FHR: 130's bpm, variability: moderate,  accelerations:  Present,  decelerations:  Absent UC:   regular, every 2-3 minutes VE:   3/50%/Vtx/-3  AROM AF clear.   Assessment / Plan: Induction of labor due to Cholestasis of Pregnancy,  progressing well on pitocin  Fetal Wellbeing:  Category I Pain Control:  Labor support without medications/Epidural PRN  Anticipated MOD:  NSVD  Stefanie Smith,Stefanie Smith 11/15/2015, 9:11 PM

## 2015-11-16 ENCOUNTER — Encounter (HOSPITAL_COMMUNITY): Payer: Self-pay | Admitting: *Deleted

## 2015-11-16 MED ORDER — DIBUCAINE 1 % RE OINT: 1.0000 "application " | TOPICAL_OINTMENT | RECTAL | Status: DC | PRN

## 2015-11-16 MED ORDER — ONDANSETRON HCL 4 MG/2ML IJ SOLN: 4.0000 mg | INTRAMUSCULAR | Status: DC | PRN

## 2015-11-16 MED ORDER — PRENATAL MULTIVITAMIN CH
1.0000 | ORAL_TABLET | Freq: Every day | ORAL | Status: DC
  Administered 2015-11-17: 1 via ORAL
  Filled 2015-11-16: qty 1

## 2015-11-16 MED ORDER — ACETAMINOPHEN 325 MG PO TABS: 650.0000 mg | ORAL_TABLET | ORAL | Status: DC | PRN

## 2015-11-16 MED ORDER — OXYTOCIN 40 UNITS IN LACTATED RINGERS INFUSION - SIMPLE MED: 2.5000 [IU]/h | INTRAVENOUS | Status: DC | PRN

## 2015-11-16 MED ORDER — IBUPROFEN 600 MG PO TABS
600.0000 mg | ORAL_TABLET | Freq: Four times a day (QID) | ORAL | Status: DC
  Administered 2015-11-16 – 2015-11-18 (×10): 600 mg via ORAL
  Filled 2015-11-16 (×10): qty 1

## 2015-11-16 MED ORDER — WITCH HAZEL-GLYCERIN EX PADS: 1.0000 "application " | MEDICATED_PAD | CUTANEOUS | Status: DC | PRN

## 2015-11-16 MED ORDER — SIMETHICONE 80 MG PO CHEW: 80.0000 mg | CHEWABLE_TABLET | ORAL | Status: DC | PRN

## 2015-11-16 MED ORDER — COCONUT OIL OIL: 1.0000 "application " | TOPICAL_OIL | Status: DC | PRN

## 2015-11-16 MED ORDER — BENZOCAINE-MENTHOL 20-0.5 % EX AERO: 1.0000 "application " | INHALATION_SPRAY | CUTANEOUS | Status: DC | PRN

## 2015-11-16 MED ORDER — DIPHENHYDRAMINE HCL 25 MG PO CAPS: 25.0000 mg | ORAL_CAPSULE | Freq: Four times a day (QID) | ORAL | Status: DC | PRN

## 2015-11-16 MED ORDER — ZOLPIDEM TARTRATE 5 MG PO TABS: 5.0000 mg | ORAL_TABLET | Freq: Every evening | ORAL | Status: DC | PRN

## 2015-11-16 MED ORDER — ONDANSETRON HCL 4 MG PO TABS: 4.0000 mg | ORAL_TABLET | ORAL | Status: DC | PRN

## 2015-11-16 MED ORDER — SENNOSIDES-DOCUSATE SODIUM 8.6-50 MG PO TABS
2.0000 | ORAL_TABLET | ORAL | Status: DC
  Administered 2015-11-17: 2 via ORAL
  Filled 2015-11-16: qty 2

## 2015-11-16 MED ORDER — TETANUS-DIPHTH-ACELL PERTUSSIS 5-2.5-18.5 LF-MCG/0.5 IM SUSP: 0.5000 mL | Freq: Once | INTRAMUSCULAR | Status: DC

## 2015-11-16 NOTE — Progress Notes (Signed)
Patient ID: Stefanie Smith, female   DOB: 13-Jul-1990, 25 y.o.   MRN: [id number] INTERVAL NOTE:  S:   Sitting in bed, min cramping, (+) voids, small bleed, denies HA/NV/dizziness  Baby admitted to NICU this AM for lung immaturity.  O:   VSS, AAO x 3, NAD   Results for orders placed or performed during the hospital encounter of 11/15/15 (from the past 72 hour(s))  Comprehensive metabolic panel     Status: Abnormal   Collection Time: 11/15/15  8:10 AM  Result Value Ref Range   Sodium 135 135 - 145 mmol/L   Potassium 3.9 3.5 - 5.1 mmol/L   Chloride 107 101 - 111 mmol/L   CO2 20 (L) 22 - 32 mmol/L   Glucose, Bld 79 65 - 99 mg/dL   BUN 9 6 - 20 mg/dL   Creatinine, Ser 0.55 0.44 - 1.00 mg/dL   Calcium 8.9 8.9 - 10.3 mg/dL   Total Protein 6.7 6.5 - 8.1 g/dL   Albumin 3.4 (L) 3.5 - 5.0 g/dL   AST 21 15 - 41 U/L   ALT 24 14 - 54 U/L   Alkaline Phosphatase 143 (H) 38 - 126 U/L   Total Bilirubin 0.3 0.3 - 1.2 mg/dL   GFR calc non Af Amer >60 >60 mL/min   GFR calc Af Amer >60 >60 mL/min    Comment: (NOTE) The eGFR has been calculated using the CKD EPI equation. This calculation has not been validated in all clinical situations. eGFR's persistently <60 mL/min signify possible Chronic Kidney Disease.    Anion gap 8 5 - 15  CBC     Status: Abnormal   Collection Time: 11/15/15  8:10 AM  Result Value Ref Range   WBC 9.7 4.0 - 10.5 K/uL   RBC 4.37 3.87 - 5.11 MIL/uL   Hemoglobin 12.0 12.0 - 15.0 g/dL   HCT 34.3 (L) 36.0 - 46.0 %   MCV 78.5 78.0 - 100.0 fL   MCH 27.5 26.0 - 34.0 pg   MCHC 35.0 30.0 - 36.0 g/dL   RDW 13.9 11.5 - 15.5 %   Platelets 220 150 - 400 K/uL  Type and screen [place]     Status: None   Collection Time: 11/15/15  8:10 AM  Result Value Ref Range   ABO/RH(D) A POS    Antibody Screen NEG    Sample Expiration 11/18/2015   RPR     Status: None   Collection Time: 11/15/15  8:10 AM  Result Value Ref Range   RPR Ser Ql Non Reactive Non Reactive     Comment: (NOTE) Performed At: [facility] [place], [place] [id number] Lindon Romp MD [id number]     U-2  Scant lochia  A / P:   PPD #0  Stable post partum  Routine PP orders  Graceann Congress, MSN, CNM 11/16/2015, 10:24 AM

## 2015-11-16 NOTE — Lactation Note (Signed)
This note was copied from a baby's chart. Lactation Consultation Note  Patient Name: Stefanie Ronnell FreshwaterMichelle Smith UEAVW'UToday's Date: 11/16/2015 Reason for consult: Initial assessment;NICU baby  NICU baby 2812 hours old. Mom in the NICU holding baby, and she reports that she is pumping every 2-3 hours. Mom states that she has been bringing colostrum to NICU. Enc mom to hand express after pumping. Baby's bedside RN, [name] stated that the baby is ad lib and mom will be called to come up and nurse as she is able. Enc mom to pump after baby at breast. Left NICU booklet and LC brochure in mom's room on MBU along with additional colostrum containers and dots. Mom aware of supplies in the room, and aware of OP/BFSG and LC phone line assistance after D/C.  Maternal Data Has patient been taught Hand Expression?: Yes Does the patient have breastfeeding experience prior to this delivery?: Yes  Feeding    LATCH Score/Interventions                      Lactation Tools Discussed/Used     Consult Status Consult Status: Follow-up Date: 11/17/15 Follow-up type: In-patient    Sherlyn HayJennifer D Ariyonna Twichell 11/16/2015, 5:07 PM

## 2015-11-16 NOTE — Anesthesia Postprocedure Evaluation (Signed)
Anesthesia Post Note  Patient: Stefanie Smith  Procedure(s) Performed: * No procedures listed *  Patient location during evaluation: Mother Baby Anesthesia Type: Epidural Level of consciousness: awake and alert Pain management: pain level controlled Vital Signs Assessment: post-procedure vital signs reviewed and stable Respiratory status: spontaneous breathing, nonlabored ventilation and respiratory function stable Cardiovascular status: stable Postop Assessment: no headache, no backache and epidural receding Anesthetic complications: no     Last Vitals:  Vitals:   11/16/15 0657 11/16/15 0807  BP: 113/70 111/61  Pulse: 64 (!) 57  Resp: 18 20  Temp: 36.8 C 36.7 C    Last Pain:  Vitals:   11/16/15 0657  TempSrc:   PainSc: 0-No pain   Pain Goal: Patients Stated Pain Goal: 2 (11/15/15 1901)               Junious SilkGILBERT,Oluwafemi Villella

## 2015-11-16 NOTE — Plan of Care (Signed)
Problem: Nutritional: Goal: Mother's verbalization of comfort with breastfeeding process will improve Infant sent to NICU. Gave and explained DEBP along with storage, cleaning supplies and pumping frequency.  Problem: Role Relationship: Goal: Ability to demonstrate positive interaction with newborn will improve Outcome: Completed/Met Date Met: 11/16/15 Infant in NICU. Encouraged visiting infant as much as desired and to do skin to skin as much as possible when NICU staff allowed.

## 2015-11-17 LAB — CBC
HEMATOCRIT: 32.1 % — AB (ref 36.0–46.0)
HEMOGLOBIN: 11.1 g/dL — AB (ref 12.0–15.0)
MCH: 27.5 pg (ref 26.0–34.0)
MCHC: 34.6 g/dL (ref 30.0–36.0)
MCV: 79.5 fL (ref 78.0–100.0)
Platelets: 220 10*3/uL (ref 150–400)
RBC: 4.04 MIL/uL (ref 3.87–5.11)
RDW: 14.2 % (ref 11.5–15.5)
WBC: 12.1 10*3/uL — ABNORMAL HIGH (ref 4.0–10.5)

## 2015-11-17 LAB — BILE ACIDS, TOTAL: BILE ACIDS TOTAL: 10.4 umol/L (ref 4.7–24.5)

## 2015-11-17 NOTE — Progress Notes (Signed)
PPD # 1 SVD Information for the patient's newborn:  Stefanie Smith, Stefanie Smith [[id number]][[id number]]  female NICU - prematurity   Breast feeding  / Circumcision planned tomorro  S:  Reports feeling well. Has been visiting NICU through the night. Pumping regularly, feeding EBM and formula. Baby attempting at breast, no extended latch yet.   Denies itching.             Tolerating po/ No nausea or vomiting             Bleeding is light             Pain controlled with ibuprofen (OTC)             Up ad lib / ambulatory / voiding without difficulties        O:  A & O x 3, in no apparent distress              VS:  Vitals:   11/16/15 1220 11/16/15 1825 11/16/15 2100 11/17/15 0601  BP: 114/73 98/66 104/68 (!) 101/58  Pulse: (!) 55 64 67 (!) 50  Resp: 20 18 16 18   Temp: 97.9 F (36.6 C) 97.8 F (36.6 C) 98 F (36.7 C) 97.9 F (36.6 C)  TempSrc:  Oral Oral Oral  SpO2:      Weight:      Height:        LABS:  Recent Labs  11/15/15 0810 11/17/15 0511  WBC 9.7 12.1*  HGB 12.0 11.1*  HCT 34.3* 32.1*  PLT 220 220    Blood type: --/--/A POS (09/24 0810)  Rubella: Immune (03/27 0000)   I&O: I/O last 3 completed shifts: In: -  Out: 1175 [Urine:975; Blood:200]          No intake/output data recorded.  Lungs: Clear and unlabored  Heart: regular rate and rhythm / no murmurs  Abdomen: soft, non-tender, non-distended             Fundus: firm, non-tender, U-1  Perineum: repair intact, minimal edema  Lochia: small  Extremities: no edema, no calf pain or tenderness    A/P: PPD # 1 25 y.o., G2P0102 ICP, IOL 5672w4d  Principal Problem:   Postpartum care following vaginal delivery (9/25) Active Problems:   Postpartum state ICP - resolving  Doing well - stable status  Routine post partum orders  Anticipate discharge tomorrow    Neta Mendsaniela C Furman Trentman, MSN, CNM 11/17/2015, 8:21 AM

## 2015-11-17 NOTE — Lactation Note (Signed)
This note was copied from a baby's chart. Lactation Consultation Note  Patient Name: Stefanie Ronnell FreshwaterMichelle Smith ZYSAY'TToday's Date: 11/17/2015 Reason for consult: Follow-up assessment;NICU baby Mom pumping every 3 hours and obtaining a few mls of colostrum.  Assisted with breastfeeding in NICU.  Baby is sleeping and showing little interest in eating.  Positioned baby skin to skin in football hold.  Several attempts made but baby latched only briefly.  16 mm nipple shield also tried for better oral stimulation but no suck elicited.  Reassured mom.  Encouraged her to continue pumping and will assist again tomorrow.  Maternal Data    Feeding Feeding Type: Breast Fed Nipple Type: Slow - flow Length of feed: 20 min  LATCH Score/Interventions Latch: Repeated attempts needed to sustain latch, nipple held in mouth throughout feeding, stimulation needed to elicit sucking reflex. Intervention(s): Skin to skin;Teach feeding cues;Waking techniques Intervention(s): Breast compression;Breast massage;Assist with latch;Adjust position  Audible Swallowing: None  Type of Nipple: Everted at rest and after stimulation  Comfort (Breast/Nipple): Soft / non-tender     Hold (Positioning): Assistance needed to correctly position infant at breast and maintain latch. Intervention(s): Breastfeeding basics reviewed;Support Pillows;Position options;Skin to skin  LATCH Score: 6  Lactation Tools Discussed/Used     Consult Status Consult Status: Follow-up Date: 11/18/15 Follow-up type: In-patient    Huston FoleyMOULDEN, Stefanie Langhans S 11/17/2015, 3:49 PM

## 2015-11-17 NOTE — Plan of Care (Signed)
Problem: Nutritional: Goal: Mother's verbalization of comfort with breastfeeding process will improve Outcome: Completed/Met Date Met: 11/17/15 Patient has DEBP and is pumping regularly and is beginning to breast feed baby with assistance in NICU

## 2015-11-18 MED ORDER — IBUPROFEN 600 MG PO TABS: 600.0000 mg | ORAL_TABLET | Freq: Four times a day (QID) | ORAL | 0 refills | Status: DC

## 2015-11-18 MED ORDER — COCONUT OIL OIL: 1.0000 "application " | TOPICAL_OIL | 0 refills | Status: DC | PRN

## 2015-11-18 NOTE — Discharge Instructions (Signed)
Breast Pumping Tips °If you are breastfeeding, there may be times when you cannot feed your baby directly. Returning to work or going on a trip are common examples. Pumping allows you to store breast milk and feed it to your baby later.  °You may not get much milk when you first start to pump. Your breasts should start to make more after a few days. If you pump at the times you usually feed your baby, you may be able to keep making enough milk to feed your baby without also using formula. The more often you pump, the more milk you will produce.  °WHEN SHOULD I PUMP?  °· You can begin to pump soon after delivery. However, some experts recommend waiting about 4 weeks before giving your infant a bottle to make sure breastfeeding is going well.  °· If you plan to return to work, begin pumping a few weeks before. This will help you develop techniques that work best for you. It also lets you build up a supply of breast milk.   °· When you are with your infant, feed on demand and pump after each feeding.   °· When you are away from your infant for several hours, pump for about 15 minutes every 2-3 hours. Pump both breasts at the same time if you can.   °· If your infant has a formula feeding, make sure to pump around the same time.     °· If you drink any alcohol, wait 2 hours before pumping.   °HOW DO I PREPARE TO PUMP? °Your let-down reflex is the natural reaction to stimulation that makes your breast milk flow. It is easier to stimulate this reflex when you are relaxed. Find relaxation techniques that work for you. If you have difficulty with your let-down reflex, try these methods:  °· Smell one of your infant's blankets or an item of clothing.   °· Look at a picture or video of your infant.   °· Sit in a quiet, private space.   °· Massage the breast you plan to pump.   °· Place soothing warmth on the breast.   °· Play relaxing music.   °WHAT ARE SOME GENERAL BREAST PUMPING TIPS? °· Wash your hands before you pump. You  do not need to wash your nipples or breasts. °· There are three ways to pump. °¨ You can use your hand to massage and compress your breast. °¨ You can use a handheld manual pump. °¨ You can use an electric pump.   °· Make sure the suction cup (flange) on the breast pump is the right size. Place the flange directly over the nipple. If it is the wrong size or placed the wrong way, it may be painful and cause nipple damage.   °· If pumping is uncomfortable, apply a small amount of purified or modified lanolin to your nipple and areola. °· If you are using an electric pump, adjust the speed and suction power to be more comfortable. °· If pumping is painful or if you are not getting very much milk, you may need a different type of pump. A lactation consultant can help you determine what type of pump to use.   °· Keep a full water bottle near you at all times. Drinking lots of fluid helps you make more milk.  °· You can store your milk to use later. Pumped breast milk can be stored in a sealable, sterile container or plastic bag. Label all stored breast milk with the date you pumped it. °¨ Milk can stay out at room temperature for up to 8 hours. °¨   You can store your milk in the refrigerator for up to 8 days. °¨ You can store your milk in the freezer for 3 months. Thaw frozen milk using warm water. Do not put it in the microwave. °· Do not smoke. Smoking can lower your milk supply and harm your infant. If you need help quitting, ask your health care provider to recommend a program.   °WHEN SHOULD I CALL MY HEALTH CARE PROVIDER OR A LACTATION CONSULTANT? °· You are having trouble pumping. °· You are concerned that you are not making enough milk. °· You have nipple pain, soreness, or redness. °· You want to use birth control. Birth control pills may lower your milk supply. Talk to your health care provider about your options. °  °This information is not intended to replace advice given to you by your health care provider.  Make sure you discuss any questions you have with your health care provider. °  °Document Released: 07/28/2009 Document Revised: 02/12/2013 Document Reviewed: 11/30/2012 °Elsevier Interactive Patient Education ©2016 [employer] ° °

## 2015-11-18 NOTE — Progress Notes (Signed)
Post Partum Day #2           Information for the patient's newborn:  Stefanie Smith, Boy Stefanie Smith [[id number]][[id number]]  female  circumcision planned in NICU Feeding: breast, pumping and directly at breast  Subjective: No HA, SOB, CP, F/C, breast symptoms. Pain minimal. Normal vaginal bleeding, no clots.      Objective:  VS:  Vitals:   11/16/15 2100 11/17/15 0601 11/17/15 2155 11/18/15 0600  BP: 104/68 (!) 101/58 93/68 (!) 103/56  Pulse: 67 (!) 50 (!) 55 98  Resp: 16 18 16 16   Temp: 98 F (36.7 C) 97.9 F (36.6 C) 98 F (36.7 C) 98 F (36.7 C)  TempSrc: Oral Oral Oral Oral  SpO2:      Weight:      Height:        No intake or output data in the 24 hours ending 11/18/15 0832     Recent Labs  11/17/15 0511  WBC 12.1*  HGB 11.1*  HCT 32.1*  PLT 220    Blood type: --/--/A POS (09/24 0810) Rubella: Immune (03/27 0000)    Physical Exam:  General: alert, cooperative and no distress Uterine Fundus: firm Lochia: appropriate Perineum: repair intact, edema minimal DVT Evaluation: No cords or calf tenderness. No significant calf/ankle edema.    Assessment/Plan: PPD # 2 / 25 y.o., [id number] S/P:induced vaginal - ICP   Principal Problem:   Postpartum care following vaginal delivery (9/25) Active Problems:   Postpartum state    normal postpartum exam  Continue current postpartum care  D/C home   LOS: 3 days   Stefanie Smith, CNM, MSN 11/18/2015, 8:32 AM

## 2015-11-18 NOTE — Progress Notes (Signed)
CSW acknowledges NICU admission.    Patient screened out for psychosocial assessment since none of the following apply:  Psychosocial stressors documented in mother or baby's chart  Gestation less than 32 weeks  Code at delivery   Infant with anomalies  Please contact the Clinical Social Worker if specific needs arise, or by MOB's request.       

## 2015-11-18 NOTE — Discharge Summary (Signed)
Obstetric Discharge Summary Reason for Admission: induction of labor and cholestasis of pregnancy Prenatal Procedures: NST and ultrasound, betamethasone injections Intrapartum Procedures: spontaneous vaginal delivery Postpartum Procedures: none Complications-Operative and Postpartum: 2nd degree perineal laceration Hemoglobin  Date Value Ref Range Status  11/17/2015 11.1 (L) 12.0 - 15.0 g/dL Final   HCT  Date Value Ref Range Status  11/17/2015 32.1 (L) 36.0 - 46.0 % Final    Physical Exam:  General: alert, cooperative and no distress Lochia: appropriate Uterine Fundus: firm Incision: healing well DVT Evaluation: No cords or calf tenderness. No significant calf/ankle edema.  Discharge Diagnoses: late preterm, delivered  Discharge Information: Date: 11/18/2015 Activity: pelvic rest Diet: routine Medications: PNV and Ibuprofen Condition: stable Instructions: refer to practice specific booklet Discharge to: home Follow-up Information    Stefanie Smith,MARIE-LYNE, MD. Schedule an appointment as soon as possible for a visit in 6 week(s).   Specialty:  Obstetrics and Gynecology Why:  Postpartum visit Contact information: [address] [place] [id number] [phone]           Newborn Data: Live born female  Birth Weight: 6 lb 14.4 oz (3130 g) APGAR: 8, 9   Remains inpatient NICU - prematurity.  Neta MendsDaniela C Paul, CNM 11/18/2015, 8:46 AM

## 2015-11-23 ENCOUNTER — Ambulatory Visit: Payer: Self-pay

## 2015-11-23 DIAGNOSIS — R300 Dysuria: Secondary | ICD-10-CM | POA: Diagnosis not present

## 2015-11-23 NOTE — Lactation Note (Signed)
This note was copied from a baby's chart. Lactation Consultation Note  Patient Name: Stefanie Ronnell FreshwaterMichelle Palazzi RUEAV'WToday's Date: 11/23/2015 Reason for consult: Follow-up assessment;NICU baby  NICU baby 357 days old. Mom reports that baby was latching well a few days ago, but was very fussy at the breast last night. Discussed infant behavior and enc STS--and "wearing" the baby at home. Enc putting the baby to breast first with each feeding and then offering bottle of EBM. Enc mom to post-pump after each feeding as well. Mom reports that she is getting 30-40 ml each time she pumps. Discussed the benefits of STS and nuzzling/latching at the breast to breast milk supply. Offered to make mom an OP appointment, but mom declined. Mom reports that she will call as needed. Mom aware of OP/BFSG and LC phone line assistance after D/C.  Maternal Data    Feeding Feeding Type: Breast Milk  LATCH Score/Interventions                      Lactation Tools Discussed/Used     Consult Status Consult Status: PRN    Sherlyn HayJennifer D Areal Cochrane 11/23/2015, 9:32 AM

## 2015-12-30 DIAGNOSIS — O2663 Liver and biliary tract disorders in the puerperium: Secondary | ICD-10-CM | POA: Diagnosis not present

## 2015-12-30 DIAGNOSIS — O26619 Liver and biliary tract disorders in pregnancy, unspecified trimester: Secondary | ICD-10-CM | POA: Diagnosis not present

## 2016-04-07 DIAGNOSIS — K080 Exfoliation of teeth due to systemic causes: Secondary | ICD-10-CM | POA: Diagnosis not present

## 2016-06-14 DIAGNOSIS — R42 Dizziness and giddiness: Secondary | ICD-10-CM | POA: Diagnosis not present

## 2016-06-14 DIAGNOSIS — R197 Diarrhea, unspecified: Secondary | ICD-10-CM | POA: Diagnosis not present

## 2016-06-14 DIAGNOSIS — R21 Rash and other nonspecific skin eruption: Secondary | ICD-10-CM | POA: Diagnosis not present

## 2016-06-14 DIAGNOSIS — T782XXA Anaphylactic shock, unspecified, initial encounter: Secondary | ICD-10-CM | POA: Diagnosis not present

## 2016-06-14 DIAGNOSIS — R109 Unspecified abdominal pain: Secondary | ICD-10-CM | POA: Diagnosis not present

## 2016-06-14 DIAGNOSIS — D72829 Elevated white blood cell count, unspecified: Secondary | ICD-10-CM | POA: Diagnosis not present

## 2016-06-14 DIAGNOSIS — R112 Nausea with vomiting, unspecified: Secondary | ICD-10-CM | POA: Diagnosis not present

## 2016-06-14 DIAGNOSIS — T781XXA Other adverse food reactions, not elsewhere classified, initial encounter: Secondary | ICD-10-CM | POA: Diagnosis not present

## 2016-06-14 DIAGNOSIS — R55 Syncope and collapse: Secondary | ICD-10-CM | POA: Diagnosis not present

## 2016-06-22 DIAGNOSIS — T781XXD Other adverse food reactions, not elsewhere classified, subsequent encounter: Secondary | ICD-10-CM | POA: Diagnosis not present

## 2016-06-22 DIAGNOSIS — T782XXD Anaphylactic shock, unspecified, subsequent encounter: Secondary | ICD-10-CM | POA: Diagnosis not present

## 2016-06-22 DIAGNOSIS — Z13220 Encounter for screening for lipoid disorders: Secondary | ICD-10-CM | POA: Diagnosis not present

## 2016-07-14 ENCOUNTER — Encounter: Payer: Self-pay | Admitting: Allergy and Immunology

## 2016-07-14 ENCOUNTER — Ambulatory Visit: Payer: Self-pay | Admitting: Allergy and Immunology

## 2016-07-14 ENCOUNTER — Ambulatory Visit (INDEPENDENT_AMBULATORY_CARE_PROVIDER_SITE_OTHER): Payer: Federal, State, Local not specified - PPO | Admitting: Allergy and Immunology

## 2016-07-14 VITALS — BP 106/70 | HR 74 | Temp 98.1°F | Resp 16 | Ht 65.95 in | Wt 177.5 lb

## 2016-07-14 DIAGNOSIS — H1013 Acute atopic conjunctivitis, bilateral: Secondary | ICD-10-CM

## 2016-07-14 DIAGNOSIS — Z13220 Encounter for screening for lipoid disorders: Secondary | ICD-10-CM | POA: Diagnosis not present

## 2016-07-14 DIAGNOSIS — T7800XD Anaphylactic reaction due to unspecified food, subsequent encounter: Secondary | ICD-10-CM | POA: Diagnosis not present

## 2016-07-14 DIAGNOSIS — T7840XA Allergy, unspecified, initial encounter: Secondary | ICD-10-CM | POA: Insufficient documentation

## 2016-07-14 DIAGNOSIS — J3089 Other allergic rhinitis: Secondary | ICD-10-CM | POA: Diagnosis not present

## 2016-07-14 DIAGNOSIS — T781XXD Other adverse food reactions, not elsewhere classified, subsequent encounter: Secondary | ICD-10-CM | POA: Diagnosis not present

## 2016-07-14 DIAGNOSIS — H1010 Acute atopic conjunctivitis, unspecified eye: Secondary | ICD-10-CM | POA: Insufficient documentation

## 2016-07-14 DIAGNOSIS — J4520 Mild intermittent asthma, uncomplicated: Secondary | ICD-10-CM | POA: Insufficient documentation

## 2016-07-14 DIAGNOSIS — T7840XD Allergy, unspecified, subsequent encounter: Secondary | ICD-10-CM

## 2016-07-14 DIAGNOSIS — T7800XA Anaphylactic reaction due to unspecified food, initial encounter: Secondary | ICD-10-CM | POA: Insufficient documentation

## 2016-07-14 DIAGNOSIS — R062 Wheezing: Secondary | ICD-10-CM

## 2016-07-14 MED ORDER — ALBUTEROL SULFATE HFA 108 (90 BASE) MCG/ACT IN AERS: 2.0000 | INHALATION_SPRAY | Freq: Four times a day (QID) | RESPIRATORY_TRACT | 1 refills | Status: DC | PRN

## 2016-07-14 MED ORDER — OLOPATADINE HCL 0.7 % OP SOLN: 1.0000 [drp] | OPHTHALMIC | 5 refills | Status: DC

## 2016-07-14 MED ORDER — MOMETASONE FUROATE 50 MCG/ACT NA SUSP: NASAL | 5 refills | Status: DC

## 2016-07-14 MED ORDER — EPINEPHRINE 0.3 MG/0.3ML IJ SOAJ: INTRAMUSCULAR | 1 refills | Status: DC

## 2016-07-14 MED ORDER — LEVOCETIRIZINE DIHYDROCHLORIDE 5 MG PO TABS: 5.0000 mg | ORAL_TABLET | Freq: Every evening | ORAL | 5 refills | Status: DC

## 2016-07-14 NOTE — Assessment & Plan Note (Signed)
Based upon the history and food allergen skin testing today, the patient's reaction was most likely secondary to cross-contamination of tree nut allergen in the white chocolate she had consumed prior to symptom onset.  Careful avoidance of tree nuts and sesame seed as discussed.  A prescription has been provided for epinephrine auto-injector 2 pack along with instructions for proper administration.  A food allergy action plan has been provided and discussed.  Medic Alert identification is recommended.  Should significant symptoms recur or new symptoms occur in the absence of tree nut consumption or tree nut protein cross-contamination, a journal is to be kept recording any foods eaten, beverages consumed, medications taken, activities performed, and environmental conditions within a 6 hour time period prior to the onset of symptoms. For any symptoms concerning for anaphylaxis, epinephrine is to be administered and 911 is to be called immediately.

## 2016-07-14 NOTE — Assessment & Plan Note (Signed)
   Continue careful avoidance of cats.  Continue to have access to albuterol HFA, 1-2 inhalations every 4-6 hours as needed.

## 2016-07-14 NOTE — Progress Notes (Signed)
New Patient Note  RE: Stefanie Smith MRN: [id number] DOB: 07/30/1990 Date of Office Visit: 07/14/2016  Referring provider: Dyke Maes* Primary care provider: Sharmon Leyden, MD  Chief Complaint: Allergic Reaction and Allergic Rhinitis    History of present illness: Stefanie Smith is a 26 y.o. female seen today in consultation requested by Tillman Sers, FNP.  On 06/14/2016 she consumed beef taco's with lettuce and cheese as well as white chocolate candy. Approximately 1.5 to 2 hours after the meal she developed hives "all over", nausea, vomiting, diarrhea, and lightheadedness. She denies chest tightness, wheezing, dyspnea, difficulty swallowing, or difficulty speaking.  She was taken to the local ER, found to be hypotensive, was given IV fluids, diphenhydramine, famotidine, and Solu-Medrol.  She was observed for 4 or 5 hours prior to being discharged asymptomatic with stable vital signs.  She was given a prescription for epinephrine autoinjectors and prednisone. She states that when she was 26 years old she had experienced hives without an obvious trigger and without associated symptoms.  This occurred during the springtime and she was evaluated at that time by an allergist in [place] without a conclusive diagnosis. Carolanne experiences nasal congestion, rhinorrhea, sneezing, postnasal drainage, nasal pruritus, and ocular pruritus.  The symptoms occur year around but her most frequent and severe with exposure to pollens and cats.  When around cats, she experiences chest tightness, dyspnea, and wheezing.  For this reason, she carries an albuterol rescue inhaler.   Assessment and plan: Allergy with anaphylaxis due to food Based upon the history and food allergen skin testing today, the patient's reaction was most likely secondary to cross-contamination of tree nut allergen in the white chocolate she had consumed prior to symptom onset.  Careful avoidance of tree nuts and  sesame seed as discussed.  A prescription has been provided for epinephrine auto-injector 2 pack along with instructions for proper administration.  A food allergy action plan has been provided and discussed.  Medic Alert identification is recommended.  Should significant symptoms recur or new symptoms occur in the absence of tree nut consumption or tree nut protein cross-contamination, a journal is to be kept recording any foods eaten, beverages consumed, medications taken, activities performed, and environmental conditions within a 6 hour time period prior to the onset of symptoms. For any symptoms concerning for anaphylaxis, epinephrine is to be administered and 911 is to be called immediately.   Seasonal and perennial allergic rhinitis  Aeroallergen avoidance measures have been discussed and provided in written form.  A prescription has been provided for levocetirizine, 5mg  daily as needed.  A prescription has been provided for Nasonex nasal spray, one spray per nostril 1-2 times daily as needed. Proper nasal spray technique has been discussed and demonstrated.  I have also recommended nasal saline spray (i.e., Simply Saline) or nasal saline lavage (i.e., NeilMed) as needed and prior to medicated nasal sprays.  The risks and benefits of aeroallergen immunotherapy have been discussed. The patient is motivated to initiate immunotherapy if insurance coverage is favorable. She will let us know how she would like to proceed.  Allergic conjunctivitis  Treatment plan as outlined above for allergic rhinitis.  A prescription has been provided for Pazeo, one drop per eye daily as needed.  Aeroallergen-induced wheezing  Continue careful avoidance of cats.  Continue to have access to albuterol HFA, 1-2 inhalations every 4-6 hours as needed.   Meds ordered this encounter  Medications  . EPINEPHrine (AUVI-Q) 0.3 mg/0.3 mL IJ SOAJ  injection    Sig: Use as directed for severe allergic  reactions    Dispense:  2 Device    Refill:  1  . levocetirizine (XYZAL) 5 MG tablet    Sig: Take 1 tablet (5 mg total) by mouth every evening.    Dispense:  34 tablet    Refill:  5  . mometasone (NASONEX) 50 MCG/ACT nasal spray    Sig: 1 spray 1-2 times daily as needed    Dispense:  17 g    Refill:  5  . Olopatadine HCl (PAZEO) 0.7 % SOLN    Sig: Place 1 drop into both eyes 1 day or 1 dose.    Dispense:  1 Bottle    Refill:  5  . albuterol (PROVENTIL HFA) 108 (90 Base) MCG/ACT inhaler    Sig: Inhale 2 puffs into the lungs every 6 (six) hours as needed for wheezing or shortness of breath.    Dispense:  18 g    Refill:  1    Diagnostics: Spirometry:  Normal with an FEV1 of 98% predicted.  Please see scanned spirometry results for details. Epicutaneous testing: Positive to grass pollens, weed pollens, ragweed pollen, tree pollens, molds, and cat hair. Intradermal testing: Positive to dog epithelia and dust mite antigen. Food allergen skin testing: Positive to walnut, almond, hazelnut, coconut, and sesame seed.    Physical examination: Blood pressure 106/70, pulse 74, temperature 98.1 F (36.7 C), temperature source Oral, resp. rate 16, height 5' 5.95" (1.675 m), weight 177 lb 7.5 oz (80.5 kg), SpO2 99 %, unknown if currently breastfeeding.  General: Alert, interactive, in no acute distress. HEENT: TMs pearly gray, turbinates moderately edematous with thick discharge, post-pharynx mildly erythematous. Neck: Supple without lymphadenopathy. Lungs: Clear to auscultation without wheezing, rhonchi or rales. CV: Normal S1, S2 without murmurs. Abdomen: Nondistended, nontender. Skin: Warm and dry, without lesions or rashes. Extremities:  No clubbing, cyanosis or edema. Neuro:   Grossly intact.  Review of systems:  Review of systems negative except as noted in HPI / PMHx or noted below: Review of Systems  Constitutional: Negative.   HENT: Negative.   Eyes: Negative.   Respiratory:  Negative.   Cardiovascular: Negative.   Gastrointestinal: Negative.   Genitourinary: Negative.   Musculoskeletal: Negative.   Skin: Negative.   Neurological: Negative.   Endo/Heme/Allergies: Negative.   Psychiatric/Behavioral: Negative.     Past medical history:  Past Medical History:  Diagnosis Date  . Cholelithiases    pregnancy  . Postpartum care following vaginal delivery (9/25) 11/16/2015    Past surgical history:  Past Surgical History:  Procedure Laterality Date  . APPENDECTOMY    . TONSILLECTOMY    . WISDOM TOOTH EXTRACTION      Family history: Family History  Problem Relation Age of Onset  . Allergic rhinitis Neg Hx   . Angioedema Neg Hx   . Asthma Neg Hx   . Eczema Neg Hx   . Immunodeficiency Neg Hx   . Urticaria Neg Hx     Social history: Social History   Social History  . Marital status: Married    Spouse name: N/A  . Number of children: N/A  . Years of education: N/A   Occupational History  . Not on file.   Social History Main Topics  . Smoking status: Never Smoker  . Smokeless tobacco: Never Used  . Alcohol use No  . Drug use: No  . Sexual activity: Not on file   Other Topics Concern  .  Not on file   Social History Narrative  . No narrative on file   Environmental History: The patient lives in a 402-year-old house with carpeting throughout and central air/heat.  There are dogs in the home which have access to her bedroom.  There is no known mold/water damage in the home.  She is a nonsmoker.  Allergies as of 07/14/2016      Reactions   Amoxicillin Hives, Other (See Comments)   Has patient had a PCN reaction causing immediate rash, facial/tongue/throat swelling, SOB or lightheadedness with hypotension: No Has patient had a PCN reaction causing severe rash involving mucus membranes or skin necrosis: No Has patient had a PCN reaction that required hospitalization No Has patient had a PCN reaction occurring within the last 10 years: No If  all of the above answers are "NO", then may proceed with Cephalosporin use.      Medication List       Accurate as of 07/14/16  4:52 PM. Always use your most recent med list.          albuterol 108 (90 Base) MCG/ACT inhaler Commonly known as:  PROVENTIL HFA Inhale 2 puffs into the lungs every 6 (six) hours as needed for wheezing or shortness of breath.   coconut oil Oil Apply 1 application topically as needed.   EPINEPHrine 0.3 mg/0.3 mL Soaj injection Commonly known as:  EPI-PEN Inject into the muscle.   EPINEPHrine 0.3 mg/0.3 mL Soaj injection Commonly known as:  AUVI-Q Use as directed for severe allergic reactions   ibuprofen 600 MG tablet Commonly known as:  ADVIL,MOTRIN Take by mouth.   levocetirizine 5 MG tablet Commonly known as:  XYZAL Take 1 tablet (5 mg total) by mouth every evening.   mometasone 50 MCG/ACT nasal spray Commonly known as:  NASONEX 1 spray 1-2 times daily as needed   Olopatadine HCl 0.7 % Soln Commonly known as:  PAZEO Place 1 drop into both eyes 1 day or 1 dose.   prenatal multivitamin Tabs tablet Take 1 tablet by mouth at bedtime.   WAL-DRYL ALLERGY 25 mg capsule Generic drug:  diphenhydrAMINE TK 2 CAPSULES PO Q 6 HOURS PRF ITCHING OR ALLERGIES FOR UP TO 5 DAYS       Known medication allergies: Allergies  Allergen Reactions  . Amoxicillin Hives and Other (See Comments)    Has patient had a PCN reaction causing immediate rash, facial/tongue/throat swelling, SOB or lightheadedness with hypotension: No Has patient had a PCN reaction causing severe rash involving mucus membranes or skin necrosis: No Has patient had a PCN reaction that required hospitalization No Has patient had a PCN reaction occurring within the last 10 years: No If all of the above answers are "NO", then may proceed with Cephalosporin use.     I appreciate the opportunity to take part in Charlesa's care. Please do not hesitate to contact me with  questions.  Sincerely,   R. Jorene Guestarter Akiva Brassfield, MD

## 2016-07-14 NOTE — Patient Instructions (Addendum)
Allergy with anaphylaxis due to food Based upon the history and food allergen skin testing today, the patient's reaction was most likely secondary to cross-contamination of tree nut allergen in the white chocolate she had consumed prior to symptom onset.  Careful avoidance of tree nuts and sesame seed as discussed.  A prescription has been provided for epinephrine auto-injector 2 pack along with instructions for proper administration.  A food allergy action plan has been provided and discussed.  Medic Alert identification is recommended.  Should significant symptoms recur or new symptoms occur in the absence of tree nut consumption or tree nut protein cross-contamination, a journal is to be kept recording any foods eaten, beverages consumed, medications taken, activities performed, and environmental conditions within a 6 hour time period prior to the onset of symptoms. For any symptoms concerning for anaphylaxis, epinephrine is to be administered and 911 is to be called immediately.   Seasonal and perennial allergic rhinitis  Aeroallergen avoidance measures have been discussed and provided in written form.  A prescription has been provided for levocetirizine, 5mg  daily as needed.  A prescription has been provided for Nasonex nasal spray, one spray per nostril 1-2 times daily as needed. Proper nasal spray technique has been discussed and demonstrated.  I have also recommended nasal saline spray (i.e., Simply Saline) or nasal saline lavage (i.e., NeilMed) as needed and prior to medicated nasal sprays.  The risks and benefits of aeroallergen immunotherapy have been discussed. The patient is motivated to initiate immunotherapy if insurance coverage is favorable. She will let us know how she would like to proceed.  Allergic conjunctivitis  Treatment plan as outlined above for allergic rhinitis.  A prescription has been provided for Pazeo, one drop per eye daily as  needed.  Aeroallergen-induced wheezing  Continue careful avoidance of cats.  Continue to have access to albuterol HFA, 1-2 inhalations every 4-6 hours as needed.   Return for allergy injections if you decide to proceed with this therapy.  Otherwise, follow up as needed.  Reducing Pollen Exposure  The American Academy of Allergy, Asthma and Immunology suggests the following steps to reduce your exposure to pollen during allergy seasons.    1. Do not hang sheets or clothing out to dry; pollen may collect on these items. 2. Do not mow lawns or spend time around freshly cut grass; mowing stirs up pollen. 3. Keep windows closed at night.  Keep car windows closed while driving. 4. Minimize morning activities outdoors, a time when pollen counts are usually at their highest. 5. Stay indoors as much as possible when pollen counts or humidity is high and on windy days when pollen tends to remain in the air longer. 6. Use air conditioning when possible.  Many air conditioners have filters that trap the pollen spores. 7. Use a HEPA room air filter to remove pollen form the indoor air you breathe.   Control of House Dust Mite Allergen  House dust mites play a major role in allergic asthma and rhinitis.  They occur in environments with high humidity wherever human skin, the food for dust mites is found. High levels have been detected in dust obtained from mattresses, pillows, carpets, upholstered furniture, bed covers, clothes and soft toys.  The principal allergen of the house dust mite is found in its feces.  A gram of dust may contain 1,000 mites and 250,000 fecal particles.  Mite antigen is easily measured in the air during house cleaning activities.    1. Encase mattresses, including  the box spring, and pillow, in an air tight cover.  Seal the zipper end of the encased mattresses with wide adhesive tape. 2. Wash the bedding in water of 130 degrees Farenheit weekly.  Avoid cotton  comforters/quilts and flannel bedding: the most ideal bed covering is the dacron comforter. 3. Remove all upholstered furniture from the bedroom. 4. Remove carpets, carpet padding, rugs, and non-washable window drapes from the bedroom.  Wash drapes weekly or use plastic window coverings. 5. Remove all non-washable stuffed toys from the bedroom.  Wash stuffed toys weekly. 6. Have the room cleaned frequently with a vacuum cleaner and a damp dust-mop.  The patient should not be in a room which is being cleaned and should wait 1 hour after cleaning before going into the room. 7. Close and seal all heating outlets in the bedroom.  Otherwise, the room will become filled with dust-laden air.  An electric heater can be used to heat the room. Reduce indoor humidity to less than 50%.  Do not use a humidifier.  Control of Dog or Cat Allergen  Avoidance is the best way to manage a dog or cat allergy. If you have a dog or cat and are allergic to dog or cats, consider removing the dog or cat from the home. If you have a dog or cat but don't want to find it a new home, or if your family wants a pet even though someone in the household is allergic, here are some strategies that may help keep symptoms at bay:  1. Keep the pet out of your bedroom and restrict it to only a few rooms. Be advised that keeping the dog or cat in only one room will not limit the allergens to that room. 2. Don't pet, hug or kiss the dog or cat; if you do, wash your hands with soap and water. 3. High-efficiency particulate air (HEPA) cleaners run continuously in a bedroom or living room can reduce allergen levels over time. 4. Regular use of a high-efficiency vacuum cleaner or a central vacuum can reduce allergen levels. 5. Giving your dog or cat a bath at least once a week can reduce airborne allergen.  Control of Mold Allergen  Mold and fungi can grow on a variety of surfaces provided certain temperature and moisture conditions exist.   Outdoor molds grow on plants, decaying vegetation and soil.  The major outdoor mold, Alternaria and Cladosporium, are found in very high numbers during hot and dry conditions.  Generally, a late Summer - Fall peak is seen for common outdoor fungal spores.  Rain will temporarily lower outdoor mold spore count, but counts rise rapidly when the rainy period ends.  The most important indoor molds are Aspergillus and Penicillium.  Dark, humid and poorly ventilated basements are ideal sites for mold growth.  The next most common sites of mold growth are the bathroom and the kitchen.  Outdoor [employer] 6. Use air conditioning and keep windows closed 7. Avoid exposure to decaying vegetation. 8. Avoid leaf raking. 9. Avoid grain handling. 10. Consider wearing a face mask if working in moldy areas.  Indoor Mold Control 1. Maintain humidity below 50%. 2. Clean washable surfaces with 5% bleach solution. 3. Remove sources e.g. Contaminated carpets.

## 2016-07-14 NOTE — Assessment & Plan Note (Signed)
   Aeroallergen avoidance measures have been discussed and provided in written form.  A prescription has been provided for levocetirizine, 5mg daily as needed.  A prescription has been provided for Nasonex nasal spray, one spray per nostril 1-2 times daily as needed. Proper nasal spray technique has been discussed and demonstrated.  I have also recommended nasal saline spray (i.e., Simply Saline) or nasal saline lavage (i.e., NeilMed) as needed and prior to medicated nasal sprays. The risks and benefits of aeroallergen immunotherapy have been discussed. The patient is motivated to initiate immunotherapy if insurance coverage is favorable. She will let us know how she would like to proceed. 

## 2016-07-14 NOTE — Assessment & Plan Note (Signed)
   Treatment plan as outlined above for allergic rhinitis.  A prescription has been provided for Pazeo, one drop per eye daily as needed. 

## 2016-07-15 ENCOUNTER — Other Ambulatory Visit: Payer: Self-pay

## 2016-07-15 DIAGNOSIS — H1013 Acute atopic conjunctivitis, bilateral: Secondary | ICD-10-CM

## 2016-07-15 MED ORDER — OLOPATADINE HCL 0.7 % OP SOLN: 1.0000 [drp] | OPHTHALMIC | 5 refills | Status: DC

## 2016-07-15 NOTE — Telephone Encounter (Signed)
[facility] x 5 [facility]

## 2016-07-21 DIAGNOSIS — Z6828 Body mass index (BMI) 28.0-28.9, adult: Secondary | ICD-10-CM | POA: Diagnosis not present

## 2016-07-21 DIAGNOSIS — Z0000 Encounter for general adult medical examination without abnormal findings: Secondary | ICD-10-CM | POA: Diagnosis not present

## 2016-07-21 DIAGNOSIS — E663 Overweight: Secondary | ICD-10-CM | POA: Diagnosis not present

## 2016-10-06 DIAGNOSIS — K080 Exfoliation of teeth due to systemic causes: Secondary | ICD-10-CM | POA: Diagnosis not present

## 2016-11-16 ENCOUNTER — Encounter: Payer: Self-pay | Admitting: Allergy and Immunology

## 2016-11-16 ENCOUNTER — Ambulatory Visit (INDEPENDENT_AMBULATORY_CARE_PROVIDER_SITE_OTHER): Payer: Federal, State, Local not specified - PPO | Admitting: Allergy and Immunology

## 2016-11-16 VITALS — BP 90/70 | HR 72 | Temp 98.0°F | Resp 16

## 2016-11-16 DIAGNOSIS — T7800XD Anaphylactic reaction due to unspecified food, subsequent encounter: Secondary | ICD-10-CM | POA: Diagnosis not present

## 2016-11-16 DIAGNOSIS — J4520 Mild intermittent asthma, uncomplicated: Secondary | ICD-10-CM

## 2016-11-16 DIAGNOSIS — J3089 Other allergic rhinitis: Secondary | ICD-10-CM

## 2016-11-16 DIAGNOSIS — H1013 Acute atopic conjunctivitis, bilateral: Secondary | ICD-10-CM | POA: Diagnosis not present

## 2016-11-16 NOTE — Assessment & Plan Note (Signed)
   Treatment as outlined above for allergic rhinitis.  Continue olopatadine eyedrops daily as needed.

## 2016-11-16 NOTE — Patient Instructions (Signed)
Seasonal and perennial allergic rhinitis  Continue appropriate allergen avoidance measures, Nasonex nasal spray as needed, nasal saline irrigation as needed, and levocetirizine as needed.  If allergen avoidance measures and medications fail to adequately relieve symptoms, aeroallergen immunotherapy will be considered.  Allergic conjunctivitis  Treatment as outlined above for allergic rhinitis.  Continue olopatadine eyedrops daily as needed.  Aeroallergen-induced wheezing  Continue avoidance of aeroallergen triggers and have access to albuterol HFA, 1-2 inhalations every 4-6 hours if needed.  Food allergy  Continue careful avoidance of tree nuts and sesame seeds and have access to epinephrine autoinjector 2 pack in case of accidental ingestion.  Food allergy action plan is in place.   Return in about 1 year (around 11/16/2017), or if symptoms worsen or fail to improve.

## 2016-11-16 NOTE — Progress Notes (Signed)
Follow-up Note  RE: Stefanie Smith MRN: [id number] DOB: [birth year] Date of Office Visit: 11/16/2016  Primary care provider: Sharmon Leyden, MD Referring provider: Sharmon Leyden, MD  History of present illness: Stefanie Smith is a 26 y.o. female with allergic rhinoconjunctivitis, allergic bronchitis, and food allergy presenting today for follow up.  She was previously seen in this clinic for her initial evaluation on 07/14/2016.  She reports that in the interval since her previous visit her nasal and ocular allergy symptoms have improved significantly if she consistently uses the medications as prescribed.  She is interested in starting aeroallergen immunotherapy, however because she and her husband are planning on having a third child would like to do so after delivery.  She has defervesced to avoid cats and has not experienced any episodes of wheezing over the past few months.  She has successfully avoiding tree nuts and sesame seeds without accident congestion and has access to epinephrine autoinjectors.   Assessment and plan: Seasonal and perennial allergic rhinitis  Continue appropriate allergen avoidance measures, Nasonex nasal spray as needed, nasal saline irrigation as needed, and levocetirizine as needed.  If allergen avoidance measures and medications fail to adequately relieve symptoms, aeroallergen immunotherapy will be considered.  Allergic conjunctivitis  Treatment as outlined above for allergic rhinitis.  Continue olopatadine eyedrops daily as needed.  Aeroallergen-induced wheezing  Continue avoidance of aeroallergen triggers and have access to albuterol HFA, 1-2 inhalations every 4-6 hours if needed.  Food allergy  Continue careful avoidance of tree nuts and sesame seeds and have access to epinephrine autoinjector 2 pack in case of accidental ingestion.  Food allergy action plan is in place.   Diagnostics: Spirometry:  Normal with an FEV1 of 106%  predicted.  Please see scanned spirometry results for details.    Physical examination: Blood pressure 90/70, pulse 72, temperature 98 F (36.7 C), temperature source Oral, resp. rate 16, SpO2 99 %, unknown if currently breastfeeding.  General: Alert, interactive, in no acute distress. HEENT: TMs pearly gray, turbinates minimally edematous without discharge, post-pharynx unremarkable. Neck: Supple without lymphadenopathy. Lungs: Clear to auscultation without wheezing, rhonchi or rales. CV: Normal S1, S2 without murmurs. Skin: Warm and dry, without lesions or rashes.  The following portions of the patient's history were reviewed and updated as appropriate: allergies, current medications, past family history, past medical history, past social history, past surgical history and problem list.  Allergies as of 11/16/2016      Reactions   Other Anaphylaxis   Tree nuts, coconut, sesame seeds    Amoxicillin Hives, Other (See Comments)   Has patient had a PCN reaction causing immediate rash, facial/tongue/throat swelling, SOB or lightheadedness with hypotension: No Has patient had a PCN reaction causing severe rash involving mucus membranes or skin necrosis: No Has patient had a PCN reaction that required hospitalization No Has patient had a PCN reaction occurring within the last 10 years: No If all of the above answers are "NO", then may proceed with Cephalosporin use.      Medication List       Accurate as of 11/16/16 12:10 PM. Always use your most recent med list.          albuterol 108 (90 Base) MCG/ACT inhaler Commonly known as:  PROVENTIL HFA Inhale 2 puffs into the lungs every 6 (six) hours as needed for wheezing or shortness of breath.   EPINEPHrine 0.3 mg/0.3 mL Soaj injection Commonly known as:  AUVI-Q Use as directed for severe allergic reactions  ibuprofen 600 MG tablet Commonly known as:  ADVIL,MOTRIN Take by mouth.   levocetirizine 5 MG tablet Commonly known as:   XYZAL Take 1 tablet (5 mg total) by mouth every evening.   mometasone 50 MCG/ACT nasal spray Commonly known as:  NASONEX 1 spray 1-2 times daily as needed   Olopatadine HCl 0.7 % Soln Commonly known as:  PAZEO Place 1 drop into both eyes 1 day or 1 dose.   prenatal multivitamin Tabs tablet Take 1 tablet by mouth at bedtime.            Discharge Care Instructions        Start     Ordered   11/16/16 0000  Spirometry with Graph    Question Answer Comment  Where should this test be performed? Other   Basic spirometry Yes      11/16/16 1143      Allergies  Allergen Reactions  . Other Anaphylaxis    Tree nuts, coconut, sesame seeds   . Amoxicillin Hives and Other (See Comments)    Has patient had a PCN reaction causing immediate rash, facial/tongue/throat swelling, SOB or lightheadedness with hypotension: No Has patient had a PCN reaction causing severe rash involving mucus membranes or skin necrosis: No Has patient had a PCN reaction that required hospitalization No Has patient had a PCN reaction occurring within the last 10 years: No If all of the above answers are "NO", then may proceed with Cephalosporin use.     I appreciate the opportunity to take part in Stefanie Smith's care. Please do not hesitate to contact me with questions.  Sincerely,   R. Jorene Guest, MD

## 2016-11-16 NOTE — Assessment & Plan Note (Signed)
   Continue avoidance of aeroallergen triggers and have access to albuterol HFA, 1-2 inhalations every 4-6 hours if needed.

## 2016-11-16 NOTE — Assessment & Plan Note (Signed)
   Continue appropriate allergen avoidance measures, Nasonex nasal spray as needed, nasal saline irrigation as needed, and levocetirizine as needed.  If allergen avoidance measures and medications fail to adequately relieve symptoms, aeroallergen immunotherapy will be considered.

## 2016-11-16 NOTE — Assessment & Plan Note (Signed)
   Continue careful avoidance of tree nuts and sesame seeds and have access to epinephrine autoinjector 2 pack in case of accidental ingestion.  Food allergy action plan is in place.

## 2017-02-20 ENCOUNTER — Other Ambulatory Visit: Payer: Self-pay | Admitting: Allergy

## 2017-02-20 DIAGNOSIS — J3089 Other allergic rhinitis: Secondary | ICD-10-CM

## 2017-02-20 DIAGNOSIS — Z01419 Encounter for gynecological examination (general) (routine) without abnormal findings: Secondary | ICD-10-CM | POA: Diagnosis not present

## 2017-02-20 DIAGNOSIS — Z6827 Body mass index (BMI) 27.0-27.9, adult: Secondary | ICD-10-CM | POA: Diagnosis not present

## 2017-02-20 DIAGNOSIS — H1013 Acute atopic conjunctivitis, bilateral: Secondary | ICD-10-CM

## 2017-02-20 DIAGNOSIS — Z23 Encounter for immunization: Secondary | ICD-10-CM | POA: Diagnosis not present

## 2017-02-20 MED ORDER — LEVOCETIRIZINE DIHYDROCHLORIDE 5 MG PO TABS: 5.0000 mg | ORAL_TABLET | Freq: Every evening | ORAL | 5 refills | Status: DC

## 2017-02-21 NOTE — L&D Delivery Note (Signed)
Delivery Note At 10:21 PM a viable female was delivered via  (Presentation: ; DOA ).  APGAR: 8, 9; weight  pending.   Placenta status: spontaneous, intact , .  Cord:  3VC  with the following complications: .none  Cord pH: n/a  Anesthesia:  epidural Episiotomy:  none Lacerations:  2nd degree laceration Suture Repair: 3.0 vicryl rapide Est. Blood Loss (mL):  350cc  Mom to postpartum.  Baby to Couplet care / Skin to Skin.  Lendon ColonelKelly A Mirha Brucato 11/08/2017, 10:40 PM

## 2017-03-23 DIAGNOSIS — Z3201 Encounter for pregnancy test, result positive: Secondary | ICD-10-CM | POA: Diagnosis not present

## 2017-04-05 DIAGNOSIS — Z3201 Encounter for pregnancy test, result positive: Secondary | ICD-10-CM | POA: Diagnosis not present

## 2017-04-13 DIAGNOSIS — K080 Exfoliation of teeth due to systemic causes: Secondary | ICD-10-CM | POA: Diagnosis not present

## 2017-04-24 DIAGNOSIS — O2331 Infections of other parts of urinary tract in pregnancy, first trimester: Secondary | ICD-10-CM | POA: Diagnosis not present

## 2017-04-24 DIAGNOSIS — Z3A09 9 weeks gestation of pregnancy: Secondary | ICD-10-CM | POA: Diagnosis not present

## 2017-04-24 DIAGNOSIS — R300 Dysuria: Secondary | ICD-10-CM | POA: Diagnosis not present

## 2017-05-03 DIAGNOSIS — Z8759 Personal history of other complications of pregnancy, childbirth and the puerperium: Secondary | ICD-10-CM | POA: Diagnosis not present

## 2017-05-03 DIAGNOSIS — Z3689 Encounter for other specified antenatal screening: Secondary | ICD-10-CM | POA: Diagnosis not present

## 2017-05-03 DIAGNOSIS — Z3481 Encounter for supervision of other normal pregnancy, first trimester: Secondary | ICD-10-CM | POA: Diagnosis not present

## 2017-05-03 DIAGNOSIS — Z113 Encounter for screening for infections with a predominantly sexual mode of transmission: Secondary | ICD-10-CM | POA: Diagnosis not present

## 2017-05-03 LAB — OB RESULTS CONSOLE RUBELLA ANTIBODY, IGM: Rubella: IMMUNE

## 2017-05-03 LAB — OB RESULTS CONSOLE GC/CHLAMYDIA
Chlamydia: NEGATIVE
Gonorrhea: NEGATIVE

## 2017-05-03 LAB — OB RESULTS CONSOLE ABO/RH: RH Type: POSITIVE

## 2017-05-03 LAB — OB RESULTS CONSOLE RPR: RPR: NONREACTIVE

## 2017-05-03 LAB — OB RESULTS CONSOLE HEPATITIS B SURFACE ANTIGEN: HEP B S AG: NEGATIVE

## 2017-05-03 LAB — OB RESULTS CONSOLE ANTIBODY SCREEN: Antibody Screen: NEGATIVE

## 2017-05-03 LAB — OB RESULTS CONSOLE HIV ANTIBODY (ROUTINE TESTING): HIV: NONREACTIVE

## 2017-05-17 DIAGNOSIS — Z3481 Encounter for supervision of other normal pregnancy, first trimester: Secondary | ICD-10-CM | POA: Diagnosis not present

## 2017-05-17 DIAGNOSIS — Z3682 Encounter for antenatal screening for nuchal translucency: Secondary | ICD-10-CM | POA: Diagnosis not present

## 2017-06-21 DIAGNOSIS — Z3482 Encounter for supervision of other normal pregnancy, second trimester: Secondary | ICD-10-CM | POA: Diagnosis not present

## 2017-06-21 DIAGNOSIS — Z361 Encounter for antenatal screening for raised alphafetoprotein level: Secondary | ICD-10-CM | POA: Diagnosis not present

## 2017-07-12 DIAGNOSIS — Z3686 Encounter for antenatal screening for cervical length: Secondary | ICD-10-CM | POA: Diagnosis not present

## 2017-07-12 DIAGNOSIS — Z363 Encounter for antenatal screening for malformations: Secondary | ICD-10-CM | POA: Diagnosis not present

## 2017-07-12 DIAGNOSIS — Z3482 Encounter for supervision of other normal pregnancy, second trimester: Secondary | ICD-10-CM | POA: Diagnosis not present

## 2017-07-31 DIAGNOSIS — Z8759 Personal history of other complications of pregnancy, childbirth and the puerperium: Secondary | ICD-10-CM | POA: Diagnosis not present

## 2017-07-31 DIAGNOSIS — Z3482 Encounter for supervision of other normal pregnancy, second trimester: Secondary | ICD-10-CM | POA: Diagnosis not present

## 2017-07-31 DIAGNOSIS — Z362 Encounter for other antenatal screening follow-up: Secondary | ICD-10-CM | POA: Diagnosis not present

## 2017-08-04 ENCOUNTER — Encounter (HOSPITAL_COMMUNITY): Payer: Self-pay | Admitting: *Deleted

## 2017-08-04 ENCOUNTER — Ambulatory Visit (HOSPITAL_COMMUNITY)
Admission: RE | Admit: 2017-08-04 | Discharge: 2017-08-04 | Disposition: A | Discharge status: Home / Self Care | Payer: Federal, State, Local not specified - PPO | Source: Ambulatory Visit | Attending: Obstetrics | Admitting: Obstetrics

## 2017-08-04 ENCOUNTER — Other Ambulatory Visit (HOSPITAL_COMMUNITY): Payer: Self-pay

## 2017-08-04 DIAGNOSIS — O26612 Liver and biliary tract disorders in pregnancy, second trimester: Secondary | ICD-10-CM | POA: Diagnosis not present

## 2017-08-04 DIAGNOSIS — O99612 Diseases of the digestive system complicating pregnancy, second trimester: Secondary | ICD-10-CM | POA: Diagnosis not present

## 2017-08-04 DIAGNOSIS — Z8261 Family history of arthritis: Secondary | ICD-10-CM | POA: Diagnosis not present

## 2017-08-04 DIAGNOSIS — Z91018 Allergy to other foods: Secondary | ICD-10-CM | POA: Insufficient documentation

## 2017-08-04 DIAGNOSIS — Z880 Allergy status to penicillin: Secondary | ICD-10-CM | POA: Insufficient documentation

## 2017-08-04 DIAGNOSIS — Z3A24 24 weeks gestation of pregnancy: Secondary | ICD-10-CM | POA: Insufficient documentation

## 2017-08-04 DIAGNOSIS — K831 Obstruction of bile duct: Secondary | ICD-10-CM | POA: Insufficient documentation

## 2017-08-04 HISTORY — DX: Liver and biliary tract disorders in pregnancy, unspecified trimester: O26.619

## 2017-08-04 HISTORY — DX: Obstruction of bile duct: K83.1

## 2017-08-04 HISTORY — DX: Intrahepatic cholestasis of pregnancy, unspecified trimester: O26.649

## 2017-08-04 NOTE — Consult Note (Signed)
MFM consult  Stefanie Smith is a 27 yr old G3P0202 at [redacted]w[redacted]d referred by Dr. Ernestina Penna for consult regarding her diagnosis of cholestasis.  Past OB hx: 2 preterm SVD- both induced at 36 weeks for cholestasis of pregnancy; both babies did well; no meconium; patient had severely elevated LFts with last pregnancy- normal abdominal ultrasound  Allergies: amoxicillin, nuts, coconut oil, sesame seeds Meds: PNV, ursodiol, hydroxyzine, phenergan PSH: wisdom teeth, tonsillectomy Family history: brother with rheumatoid arthritis  Patient has already had pruritis this pregnancy and had bile acids done on 6/10 which were 19.5 AST 55; ALT 78; tbili normal at 0.3  I counseled the patient as follows:   1. Discussed based on lab values and symptoms she meets criteria for intrahepatic cholestasis of pregnancy (ICP). Discussed ICP affects 0.7% of white pregnant women, approximately twice as many [place] Asian women, and up to 5% of [place] women. The recurrence rate of ICP varies from 60% to 90% in different populations.  Accordingly, she has had two prior pregnancies affected by cholestasis of pregnancy.    Fetal complications that occur more commonly in ICP pregnancies include preterm labor, fetal asphyxial events, meconium staining of amniotic fluid, and intrauterine death. Three studies have demonstrated that ICP patients with higher maternal serum bile acid levels (>40 mol/L in two studies) more commonly have pregnancies complicated by meconium-stained amniotic fluid and fetal asphyxial events, and the largest study also demonstrated that patients with higher levels of bile acids had higher rates of spontaneous preterm labor.  Periodic measuring levels of liver transaminases with coagulation profile is recommended as patients can develop a vitamin K deficiency and have elevated INR and be at risk of hemorrhage. Although ICP often identified during late pregnancy, early recurrent as also been reported.  Discussed cholestasis may also occur in conjunction with other liver diseases. 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 Discussed given early recurrence she may have a genetic predisposition that makes her more likely to develop ICP ( I.e MDR3 mutation). Also discussed recurrent cholestasis has been associated with underlying liver conditions- hepatitis, cirrhosis, autoimmune conditions, primary biliary cirrhosis etc.   Patient had normal liver ultrasound in previous pregnancy however if LFTs significantly elevate in this pregnancy I would recommend repeating this and reconsulting GI. I would recommend checking LFts and bilirubin at least every 3-[redacted] weeks along with coagulation studies. I also recommend checking hepatitis C antibody if not recently done as I do not see this result.  Ursodeoxycholic acid (UDCA) is the only drug that has consistently been shown to improve the maternal symptoms and biochemical features of ICP. There have been several reports about the efficacy of UDCA in ICP, but there have been few randomized, controlled trials. The largest trial showed that UDCA reduced levels of pruritus, liver transaminases, and bilirubin compared with dexamethasone or placebo and that it was particularly effective in women with serum levels of bile acids higher than 40 mol/L. UDCA is usually started at a dose of 300 mg twice daily, and the dose may be increased further.  No treatments have been shown to reduce fetal risks associated with ICP. However, it is likely that treatments that reduce levels of maternal bile acids also reduce fetal risk because of the data that implicate bile acids in pregnancies complicated by spontaneous preterm delivery, fetal asphyxial events, and meconium-stained amniotic fluid. None of the UDCA trials has been powered to investigate whether the drug protects the fetus. However, it is known that UDCA treatment improves  the serum bile acid levels measured in  cord blood and amniotic fluid at the time of delivery.  Many obstetric units review women with ICP several times per week for fetal assessment by electronic fetal monitoring and/or biophysical profile, or both. However discussed that antenatal testing has not been shown to reduce the risk of stillbirth in patients with cholestasis.  Given early diagnosis I recommend starting twice weekly BPPs at 28 weeks and then may change to twice weekly NSTs with weekly AFI at 32 weeks. I recommend monthly interval growth, and regular prenatal visits with plan for delivery at 37 weeks or sooner if clinically indicated.  Kick counts in the interim.   Recommendations: 1. Ursodiol 300mg  po bid (would continue until ~1wk postpartum as the bile acids may remain elevated and create pruritus for several days postpartum 2. Start antenatal testing at 28 weeks 3. Fetal kick counts 4. Interval growth by u/s monthly 5. Follow LFTs and INR every 4 weeks and refer to GI and repeat abdominal ultrasound if become severely abnormal 6. Check hepatitis C antibody 7. Delivery at 36-37 weeks 8. Close surveillance for development of preeclampsia (increased with cholestasis) 9. May use benadryl or hydroxyzine for itching as well   Greater than 50% of the 45 minute visit was spent in counseling/coordination of care for the patient regarding above.   Eulis FosterKristen Soraiya Ahner, MD

## 2017-08-28 DIAGNOSIS — Z3689 Encounter for other specified antenatal screening: Secondary | ICD-10-CM | POA: Diagnosis not present

## 2017-08-28 DIAGNOSIS — Z3482 Encounter for supervision of other normal pregnancy, second trimester: Secondary | ICD-10-CM | POA: Diagnosis not present

## 2017-09-11 DIAGNOSIS — Z23 Encounter for immunization: Secondary | ICD-10-CM | POA: Diagnosis not present

## 2017-09-11 DIAGNOSIS — O26613 Liver and biliary tract disorders in pregnancy, third trimester: Secondary | ICD-10-CM | POA: Diagnosis not present

## 2017-09-11 DIAGNOSIS — Z3689 Encounter for other specified antenatal screening: Secondary | ICD-10-CM | POA: Diagnosis not present

## 2017-09-11 DIAGNOSIS — Z3A29 29 weeks gestation of pregnancy: Secondary | ICD-10-CM | POA: Diagnosis not present

## 2017-09-25 ENCOUNTER — Other Ambulatory Visit: Payer: Self-pay | Admitting: Allergy

## 2017-09-25 DIAGNOSIS — J3089 Other allergic rhinitis: Secondary | ICD-10-CM

## 2017-09-25 DIAGNOSIS — H1013 Acute atopic conjunctivitis, bilateral: Secondary | ICD-10-CM

## 2017-09-26 ENCOUNTER — Telehealth: Payer: Self-pay | Admitting: Allergy

## 2017-09-26 ENCOUNTER — Other Ambulatory Visit: Payer: Self-pay | Admitting: Allergy

## 2017-09-26 NOTE — Telephone Encounter (Signed)
Patient is pregnant. Waiting on ok from Dr. Nunzio CobbsBobbitt before refill.

## 2017-09-27 ENCOUNTER — Telehealth: Payer: Self-pay | Admitting: Allergy

## 2017-09-27 ENCOUNTER — Other Ambulatory Visit: Payer: Self-pay | Admitting: Allergy

## 2017-09-27 MED ORDER — LEVOCETIRIZINE DIHYDROCHLORIDE 5 MG PO TABS: ORAL_TABLET | ORAL | 5 refills | Status: DC

## 2017-09-27 NOTE — Telephone Encounter (Signed)
Informed patient that we faxed in prescription for the Levocetirizine 5mg  and patient made appointment.

## 2017-09-28 DIAGNOSIS — O26613 Liver and biliary tract disorders in pregnancy, third trimester: Secondary | ICD-10-CM | POA: Diagnosis not present

## 2017-09-28 DIAGNOSIS — Z3A32 32 weeks gestation of pregnancy: Secondary | ICD-10-CM | POA: Diagnosis not present

## 2017-10-12 DIAGNOSIS — Z3A34 34 weeks gestation of pregnancy: Secondary | ICD-10-CM | POA: Diagnosis not present

## 2017-10-12 DIAGNOSIS — K080 Exfoliation of teeth due to systemic causes: Secondary | ICD-10-CM | POA: Diagnosis not present

## 2017-10-12 DIAGNOSIS — O26613 Liver and biliary tract disorders in pregnancy, third trimester: Secondary | ICD-10-CM | POA: Diagnosis not present

## 2017-10-19 DIAGNOSIS — Z3685 Encounter for antenatal screening for Streptococcus B: Secondary | ICD-10-CM | POA: Diagnosis not present

## 2017-10-19 DIAGNOSIS — O26613 Liver and biliary tract disorders in pregnancy, third trimester: Secondary | ICD-10-CM | POA: Diagnosis not present

## 2017-10-19 DIAGNOSIS — Z3A35 35 weeks gestation of pregnancy: Secondary | ICD-10-CM | POA: Diagnosis not present

## 2017-10-19 LAB — OB RESULTS CONSOLE GBS: GBS: NEGATIVE

## 2017-10-27 DIAGNOSIS — Z3A36 36 weeks gestation of pregnancy: Secondary | ICD-10-CM | POA: Diagnosis not present

## 2017-10-27 DIAGNOSIS — O26613 Liver and biliary tract disorders in pregnancy, third trimester: Secondary | ICD-10-CM | POA: Diagnosis not present

## 2017-11-02 DIAGNOSIS — Z3A37 37 weeks gestation of pregnancy: Secondary | ICD-10-CM | POA: Diagnosis not present

## 2017-11-02 DIAGNOSIS — O26613 Liver and biliary tract disorders in pregnancy, third trimester: Secondary | ICD-10-CM | POA: Diagnosis not present

## 2017-11-03 ENCOUNTER — Encounter (HOSPITAL_COMMUNITY): Payer: Self-pay | Admitting: *Deleted

## 2017-11-03 ENCOUNTER — Other Ambulatory Visit: Payer: Self-pay | Admitting: Obstetrics

## 2017-11-03 ENCOUNTER — Telehealth (HOSPITAL_COMMUNITY): Payer: Self-pay | Admitting: *Deleted

## 2017-11-03 NOTE — Telephone Encounter (Signed)
Preadmission screen  

## 2017-11-08 ENCOUNTER — Inpatient Hospital Stay (HOSPITAL_COMMUNITY)
Admission: RE | Admit: 2017-11-08 | Discharge: 2017-11-10 | DRG: 805 | Disposition: A | Discharge status: Home / Self Care | Payer: Federal, State, Local not specified - PPO | Attending: Obstetrics | Admitting: Obstetrics

## 2017-11-08 ENCOUNTER — Encounter (HOSPITAL_COMMUNITY): Payer: Self-pay

## 2017-11-08 ENCOUNTER — Inpatient Hospital Stay (HOSPITAL_COMMUNITY): Payer: Federal, State, Local not specified - PPO | Admitting: Anesthesiology

## 2017-11-08 DIAGNOSIS — E669 Obesity, unspecified: Secondary | ICD-10-CM | POA: Diagnosis not present

## 2017-11-08 DIAGNOSIS — O9902 Anemia complicating childbirth: Secondary | ICD-10-CM | POA: Diagnosis not present

## 2017-11-08 DIAGNOSIS — O99214 Obesity complicating childbirth: Secondary | ICD-10-CM | POA: Diagnosis not present

## 2017-11-08 DIAGNOSIS — K831 Obstruction of bile duct: Secondary | ICD-10-CM | POA: Diagnosis not present

## 2017-11-08 DIAGNOSIS — Z23 Encounter for immunization: Secondary | ICD-10-CM | POA: Diagnosis not present

## 2017-11-08 DIAGNOSIS — O2662 Liver and biliary tract disorders in childbirth: Principal | ICD-10-CM | POA: Diagnosis present

## 2017-11-08 DIAGNOSIS — Z3A37 37 weeks gestation of pregnancy: Secondary | ICD-10-CM

## 2017-11-08 DIAGNOSIS — Z3490 Encounter for supervision of normal pregnancy, unspecified, unspecified trimester: Secondary | ICD-10-CM | POA: Diagnosis present

## 2017-11-08 DIAGNOSIS — D649 Anemia, unspecified: Secondary | ICD-10-CM | POA: Diagnosis not present

## 2017-11-08 LAB — CBC
HCT: 34.7 % — ABNORMAL LOW (ref 36.0–46.0)
Hemoglobin: 11.6 g/dL — ABNORMAL LOW (ref 12.0–15.0)
MCH: 27.1 pg (ref 26.0–34.0)
MCHC: 33.4 g/dL (ref 30.0–36.0)
MCV: 81.1 fL (ref 78.0–100.0)
PLATELETS: 275 10*3/uL (ref 150–400)
RBC: 4.28 MIL/uL (ref 3.87–5.11)
RDW: 14.5 % (ref 11.5–15.5)
WBC: 9.6 10*3/uL (ref 4.0–10.5)

## 2017-11-08 LAB — TYPE AND SCREEN
ABO/RH(D): A POS
Antibody Screen: NEGATIVE

## 2017-11-08 LAB — RPR: RPR: NONREACTIVE

## 2017-11-08 MED ORDER — SOD CITRATE-CITRIC ACID 500-334 MG/5ML PO SOLN: 30.0000 mL | ORAL | Status: DC | PRN

## 2017-11-08 MED ORDER — OXYTOCIN BOLUS FROM INFUSION
500.0000 mL | Freq: Once | INTRAVENOUS | Status: AC
  Administered 2017-11-08: 500 mL via INTRAVENOUS

## 2017-11-08 MED ORDER — LACTATED RINGERS IV SOLN: 500.0000 mL | Freq: Once | INTRAVENOUS | Status: AC

## 2017-11-08 MED ORDER — EPHEDRINE 5 MG/ML INJ
10.0000 mg | INTRAVENOUS | Status: DC | PRN
  Filled 2017-11-08: qty 2

## 2017-11-08 MED ORDER — IBUPROFEN 600 MG PO TABS
600.0000 mg | ORAL_TABLET | Freq: Four times a day (QID) | ORAL | Status: DC
  Administered 2017-11-09 – 2017-11-10 (×6): 600 mg via ORAL
  Filled 2017-11-08 (×6): qty 1

## 2017-11-08 MED ORDER — ONDANSETRON HCL 4 MG/2ML IJ SOLN: 4.0000 mg | Freq: Four times a day (QID) | INTRAMUSCULAR | Status: DC | PRN

## 2017-11-08 MED ORDER — TERBUTALINE SULFATE 1 MG/ML IJ SOLN
0.2500 mg | Freq: Once | INTRAMUSCULAR | Status: DC | PRN
  Filled 2017-11-08: qty 1

## 2017-11-08 MED ORDER — ACETAMINOPHEN 325 MG PO TABS: 650.0000 mg | ORAL_TABLET | ORAL | Status: DC | PRN

## 2017-11-08 MED ORDER — DIPHENHYDRAMINE HCL 50 MG/ML IJ SOLN: 12.5000 mg | INTRAMUSCULAR | Status: DC | PRN

## 2017-11-08 MED ORDER — PHENYLEPHRINE 40 MCG/ML (10ML) SYRINGE FOR IV PUSH (FOR BLOOD PRESSURE SUPPORT)
80.0000 ug | PREFILLED_SYRINGE | INTRAVENOUS | Status: DC | PRN
  Filled 2017-11-08: qty 5

## 2017-11-08 MED ORDER — LACTATED RINGERS IV SOLN
500.0000 mL | Freq: Once | INTRAVENOUS | Status: AC
  Administered 2017-11-08: 500 mL via INTRAVENOUS

## 2017-11-08 MED ORDER — OXYTOCIN 40 UNITS IN LACTATED RINGERS INFUSION - SIMPLE MED
1.0000 m[IU]/min | INTRAVENOUS | Status: DC
  Administered 2017-11-08: 2 m[IU]/min via INTRAVENOUS
  Filled 2017-11-08: qty 1000

## 2017-11-08 MED ORDER — LIDOCAINE HCL (PF) 1 % IJ SOLN
30.0000 mL | INTRAMUSCULAR | Status: DC | PRN
  Filled 2017-11-08: qty 30

## 2017-11-08 MED ORDER — FENTANYL 2.5 MCG/ML BUPIVACAINE 1/10 % EPIDURAL INFUSION (WH - ANES)
14.0000 mL/h | INTRAMUSCULAR | Status: DC | PRN
  Administered 2017-11-08 (×2): 14 mL/h via EPIDURAL
  Filled 2017-11-08 (×2): qty 100

## 2017-11-08 MED ORDER — LACTATED RINGERS IV SOLN: 500.0000 mL | INTRAVENOUS | Status: DC | PRN

## 2017-11-08 MED ORDER — OXYTOCIN 40 UNITS IN LACTATED RINGERS INFUSION - SIMPLE MED: 2.5000 [IU]/h | INTRAVENOUS | Status: DC

## 2017-11-08 MED ORDER — LIDOCAINE HCL (PF) 1 % IJ SOLN
INTRAMUSCULAR | Status: DC | PRN
  Administered 2017-11-08 (×2): 4 mL via EPIDURAL

## 2017-11-08 MED ORDER — LACTATED RINGERS IV SOLN
INTRAVENOUS | Status: DC
  Administered 2017-11-08: 08:00:00 via INTRAVENOUS

## 2017-11-08 MED ORDER — PHENYLEPHRINE 40 MCG/ML (10ML) SYRINGE FOR IV PUSH (FOR BLOOD PRESSURE SUPPORT)
80.0000 ug | PREFILLED_SYRINGE | INTRAVENOUS | Status: DC | PRN
  Filled 2017-11-08: qty 10
  Filled 2017-11-08: qty 5

## 2017-11-08 NOTE — Anesthesia Preprocedure Evaluation (Addendum)
Anesthesia Evaluation  Patient identified by MRN, date of birth, ID band Patient awake    Reviewed: Allergy & Precautions, Patient's Chart, lab work & pertinent test results  Airway Mallampati: II  TM Distance: >3 FB Neck ROM: Full    Dental no notable dental hx. (+) Teeth Intact   Pulmonary asthma ,    Pulmonary exam normal breath sounds clear to auscultation       Cardiovascular negative cardio ROS Normal cardiovascular exam Rhythm:Regular Rate:Normal     Neuro/Psych negative neurological ROS  negative psych ROS   GI/Hepatic negative GI ROS, Neg liver ROS, Cholestasis of pregnancy Cholelithiasis   Endo/Other  Obesity  Renal/GU negative Renal ROS  negative genitourinary   Musculoskeletal   Abdominal (+) + obese,   Peds  Hematology  (+) anemia ,   Anesthesia Other Findings   Reproductive/Obstetrics (+) Pregnancy 37 6/7 weeks                            Anesthesia Physical Anesthesia Plan  ASA: II  Anesthesia Plan: Epidural   Post-op Pain Management:    Induction:   PONV Risk Score and Plan:   Airway Management Planned: Natural Airway  Additional Equipment:   Intra-op Plan:   Post-operative Plan:   Informed Consent: I have reviewed the patients History and Physical, chart, labs and discussed the procedure including the risks, benefits and alternatives for the proposed anesthesia with the patient or authorized representative who has indicated his/her understanding and acceptance.     Plan Discussed with: Anesthesiologist  Anesthesia Plan Comments:         Anesthesia Quick Evaluation

## 2017-11-08 NOTE — Progress Notes (Signed)
S: Doing well, no complaints, pain well controlled with epidural  O: BP (!) 120/54   Pulse 63   Temp 97.8 F (36.6 C) (Oral)   Resp 20   Ht 5\' 6"  (1.676 m)   Wt 87.6 kg   BMI 31.18 kg/m    FHT:  FHR: 125s bpm, variability: moderate,  accelerations:  Present,  decelerations:  Absent UC:   regular, every 4 minutes SVE:   Dilation: 5 Effacement (%): 50 Station: -3 Exam by:: Fogalman MD   A / P:  27 y.o.  OB History  Gravida Para Term Preterm AB Living  3 2 0 2 0 2  SAB TAB Ectopic Multiple Live Births  0 0 0 0 2   at 6255w6d IOL, cholestasis, adequate continued progress, cont pitocin  Fetal Wellbeing:  Category I Pain Control:  Epidural  Anticipated MOD:  NSVD  Lendon ColonelKelly A Torian Quintero 11/08/2017, 7:27 PM

## 2017-11-08 NOTE — H&P (Signed)
Stefanie Smith is a 27 y.o. [id number] at 3956w6d presenting for IOL due to cholestasis of pregnancy. Pt notes rare contractions . Good fetal movement, No vaginal bleeding, not leaking fluid. Itching has been controlled on Ursodiol.  [facility] 1st [facility] - h/o cholestasis in 2 prior preg, last with LFTs into the 500s. IOL  At 36 wks in both pregnancies. Baseline bile acid salts at 10, rose to 19 at 23 wks accompanied by severe itching and Ursodiol started. Symptoms have been stable, bps nl and bile acids returned into normal range. Fetal testing has been reactive.    Prenatal Transfer Tool  Maternal Diabetes: No Genetic Screening: Normal Maternal Ultrasounds/Referrals: Normal Fetal Ultrasounds or other Referrals:  None Maternal Substance Abuse:  No Significant Maternal Medications:  None Significant Maternal Lab Results: None     OB History    Gravida  3   Para  2   Term  0   Preterm  2   AB      Living  2     SAB      TAB      Ectopic      Multiple  0   Live Births  2          Past Medical History:  Diagnosis Date  . Cholelithiases    pregnancy  . Cholestasis of pregnancy   . Postpartum care following vaginal delivery (9/25) 11/16/2015   Past Surgical History:  Procedure Laterality Date  . TONSILLECTOMY    . WISDOM TOOTH EXTRACTION     Family History: family history includes Hypertension in her mother; Rheum arthritis in her brother; Thyroid disease in her mother. Social History:  reports that she has never smoked. She has never used smokeless tobacco. She reports that she does not drink alcohol or use drugs.  Review of Systems - Negative except intermittent itching     unknown if currently breastfeeding.  Physical Exam: will update on visit.   Prenatal labs: ABO, Rh: A/Positive/-- (03/13 0000) Antibody: n (03/13 0000) Rubella: Immune (03/13 0000) RPR: Nonreactive (03/13 0000)  HBsAg: Negative (03/13 0000)  HIV:  Non-reactive (03/13 0000)  GBS: Negative (08/29 0000)  1 hr Glucola 89  Genetic screening normal Anatomy US normal   Assessment/Plan: 27 y.o. [id number] at 3156w6d Term preg, cholestasis of preg. D/w pt R/B early delivery vs worsening cholestasis and fetal risks. While she is not with severe range labs or sx, her history is certainly concerning and given her dx of cholestasis will move to delivery. - IOL. Plan pit/ AROM  Lendon ColonelKelly A Emberli Smith 11/08/2017 7:17 AM     Lendon ColonelKelly A Erickson Smith 11/08/2017, 7:12 AM

## 2017-11-08 NOTE — Progress Notes (Signed)
[facility]  Pt is playing cards and states she is comfortable, although contractions are getting more noticeable.  Pt encouraged to ambulate or change positions, order something from clear liquid menu and rest if she is able.  Pt states she understands and has no needs at this time.

## 2017-11-08 NOTE — Anesthesia Procedure Notes (Addendum)
Epidural Patient location during procedure: OB Start time: 11/08/2017 2:20 PM End time: 11/08/2017 2:27 PM  Staffing Anesthesiologist: Mal AmabileFoster, Aubreyana Saltz, MD  Preanesthetic Checklist Completed: patient identified, site marked, surgical consent, pre-op evaluation, timeout performed, IV checked, risks and benefits discussed and monitors and equipment checked  Epidural Patient position: sitting Prep: site prepped and draped and DuraPrep Patient monitoring: continuous pulse ox and blood pressure Approach: midline Location: L4-L5 Injection technique: LOR air  Needle:  Needle type: Tuohy  Needle gauge: 17 G Needle length: 9 cm and 9 Needle insertion depth: 5 cm cm Catheter type: closed end flexible Catheter size: 19 Gauge Catheter at skin depth: 10 cm Test dose: negative and Other  Assessment Events: blood not aspirated, injection not painful, no injection resistance, negative IV test and no paresthesia  Additional Notes Patient identified. Risks and benefits discussed including failed block, incomplete  Pain control, post dural puncture headache, nerve damage, paralysis, blood pressure Changes, nausea, vomiting, reactions to medications-both toxic and allergic and post Partum back pain. All questions were answered. Patient expressed understanding and wished to proceed. Sterile technique was used throughout procedure. Epidural site was Dressed with sterile barrier dressing. No paresthesias, signs of intravascular injection Or signs of intrathecal spread were encountered.  Patient was more comfortable after the epidural was dosed. Please see RN's note for documentation of vital signs and FHR which are stable.

## 2017-11-08 NOTE — Anesthesia Pain Management Evaluation Note (Signed)
  CRNA Pain Management Visit Note  Patient: Stefanie Smith, 27 y.o., female  "Hello I am a [facility] of the anesthesia [facility]. We have an anesthesia team available at all times to provide care throughout the hospital, including epidural management and anesthesia for C-section. I don't know your plan for the delivery whether it a natural birth, water birth, IV sedation, nitrous supplementation, doula or epidural, but we want to meet your pain goals."   1.Was your pain managed to your expectations on prior hospitalizations?   Yes   2.What is your expectation for pain management during this hospitalization?     Epidural  3.How can we help you reach that goal? Planing epidural  Record the patient's initial score and the patient's pain goal.   Pain: 0  Pain Goal: 5 [facility] wants you to be able to say your pain was always managed very well.  [facility] [place] LPMERRITT,Stefanie Smith 11/08/2017

## 2017-11-08 NOTE — Progress Notes (Signed)
S: Doing well, no complaints, pain well controlled though planning epidural with stronger ctx.  O: BP 107/73   Pulse 79   Temp 97.7 F (36.5 C)   Resp 20   Ht 5\' 6"  (1.676 m)   Wt 87.6 kg   BMI 31.18 kg/m    FHT:  FHR: 120s bpm, variability: moderate,  accelerations:  Present,  decelerations:  Absent UC:   regular, every 5 minutes, pit at 12 munits/ mn SVE:   Dilation: 1 Effacement (%): 20 Station: -3 Exam by:: Fogalman MD AROM clear  A / P:  27 y.o.  OB History  Gravida Para Term Preterm AB Living  3 2 0 2 0 2  SAB TAB Ectopic Multiple Live Births  0 0 0 0 2   at 5871w6d IOL, c holestasis, minimal change with 5 hrs pitocin, AROM now  Fetal Wellbeing:  Category I Pain Control:  Labor support without medications  Anticipated MOD:  NSVD  Stefanie Smith 11/08/2017, 2:00 PM

## 2017-11-09 MED ORDER — OXYCODONE HCL 5 MG PO TABS: 10.0000 mg | ORAL_TABLET | ORAL | Status: DC | PRN

## 2017-11-09 MED ORDER — SIMETHICONE 80 MG PO CHEW: 80.0000 mg | CHEWABLE_TABLET | ORAL | Status: DC | PRN

## 2017-11-09 MED ORDER — ZOLPIDEM TARTRATE 5 MG PO TABS: 5.0000 mg | ORAL_TABLET | Freq: Every evening | ORAL | Status: DC | PRN

## 2017-11-09 MED ORDER — WITCH HAZEL-GLYCERIN EX PADS: 1.0000 "application " | MEDICATED_PAD | CUTANEOUS | Status: DC | PRN

## 2017-11-09 MED ORDER — DIPHENHYDRAMINE HCL 25 MG PO CAPS: 25.0000 mg | ORAL_CAPSULE | Freq: Four times a day (QID) | ORAL | Status: DC | PRN

## 2017-11-09 MED ORDER — PRENATAL MULTIVITAMIN CH
1.0000 | ORAL_TABLET | Freq: Every day | ORAL | Status: DC
  Administered 2017-11-09 – 2017-11-10 (×2): 1 via ORAL
  Filled 2017-11-09 (×2): qty 1

## 2017-11-09 MED ORDER — INFLUENZA VAC SPLIT QUAD 0.5 ML IM SUSY
0.5000 mL | PREFILLED_SYRINGE | INTRAMUSCULAR | Status: AC
  Administered 2017-11-09: 0.5 mL via INTRAMUSCULAR

## 2017-11-09 MED ORDER — SENNOSIDES-DOCUSATE SODIUM 8.6-50 MG PO TABS
2.0000 | ORAL_TABLET | ORAL | Status: DC
  Administered 2017-11-09 – 2017-11-10 (×2): 2 via ORAL
  Filled 2017-11-09 (×2): qty 2

## 2017-11-09 MED ORDER — OXYCODONE HCL 5 MG PO TABS: 5.0000 mg | ORAL_TABLET | ORAL | Status: DC | PRN

## 2017-11-09 MED ORDER — BENZOCAINE-MENTHOL 20-0.5 % EX AERO
1.0000 "application " | INHALATION_SPRAY | CUTANEOUS | Status: DC | PRN
  Administered 2017-11-09: 1 via TOPICAL
  Filled 2017-11-09: qty 56

## 2017-11-09 MED ORDER — ACETAMINOPHEN 325 MG PO TABS
650.0000 mg | ORAL_TABLET | ORAL | Status: DC | PRN
  Administered 2017-11-09: 650 mg via ORAL
  Filled 2017-11-09: qty 2

## 2017-11-09 MED ORDER — ONDANSETRON HCL 4 MG PO TABS: 4.0000 mg | ORAL_TABLET | ORAL | Status: DC | PRN

## 2017-11-09 MED ORDER — TETANUS-DIPHTH-ACELL PERTUSSIS 5-2.5-18.5 LF-MCG/0.5 IM SUSP: 0.5000 mL | Freq: Once | INTRAMUSCULAR | Status: DC

## 2017-11-09 MED ORDER — ONDANSETRON HCL 4 MG/2ML IJ SOLN: 4.0000 mg | INTRAMUSCULAR | Status: DC | PRN

## 2017-11-09 MED ORDER — DIBUCAINE 1 % RE OINT: 1.0000 "application " | TOPICAL_OINTMENT | RECTAL | Status: DC | PRN

## 2017-11-09 MED ORDER — COCONUT OIL OIL: 1.0000 "application " | TOPICAL_OIL | Status: DC | PRN

## 2017-11-09 NOTE — Anesthesia Postprocedure Evaluation (Signed)
Anesthesia Post Note  Patient: Ronnell FreshwaterMichelle Caissie  Procedure(s) Performed: AN AD HOC LABOR EPIDURAL     Patient location during evaluation: Mother Baby Anesthesia Type: Epidural Level of consciousness: awake Pain management: satisfactory to patient Vital Signs Assessment: post-procedure vital signs reviewed and stable Respiratory status: spontaneous breathing Cardiovascular status: stable Anesthetic complications: no    Last Vitals:  Vitals:   11/09/17 0611 11/09/17 1013  BP: 107/61 109/84  Pulse: 60 72  Resp: 18 18  Temp: 37.1 C 36.6 C  SpO2: 99%     Last Pain:  Vitals:   11/09/17 1013  TempSrc: Oral  PainSc: 0-No pain   Pain Goal:                 KeyCorpBURGER,Kenyotta Dorfman

## 2017-11-09 NOTE — Lactation Note (Signed)
This note was copied from a baby's chart. Lactation Consultation Note Baby 8 hrs old. Experienced BF mom states BF going well no assistance needed at this time. Mom didn't Bf her now 89 yr old, BF her now 27 yr old for 6 months w/o difficulty. Mom holding baby STS. Reviewed I&O, newborn behavior, and feeding habits. Mom has everted nipples. States she has colostrum. Will call for assistance or questions. None needed at this time.  [facility] brochure given w/resources, support groups and LC services.  Patient Name: Stefanie Smith FreshwaterMichelle Slane ZOXWR'UToday's Date: 11/09/2017 Reason for consult: Initial assessment;Early term 37-38.6wks   Maternal Data Has patient been taught Hand Expression?: Yes Does the patient have breastfeeding experience prior to this delivery?: Yes  Feeding Feeding Type: Breast Fed Length of feed: 25 min  LATCH Score       Type of Nipple: Everted at rest and after stimulation  Comfort (Breast/Nipple): Soft / non-tender        Interventions Interventions: Breast feeding basics reviewed  Lactation Tools Discussed/Used WIC Program: No   Consult Status Consult Status: Follow-up Date: 11/10/17 Follow-up type: In-patient    Charyl DancerCARVER, Mikiah Demond G 11/09/2017, 6:59 AM

## 2017-11-09 NOTE — Progress Notes (Signed)
PPD 1 SVD with 2nd degree repair  S:  Reports feeling well             Tolerating po/ No nausea or vomiting             Bleeding is light             Pain controlled with motrin             Up ad lib / ambulatory / voiding QS  Newborn Breast / female  O: VS: BP 109/84 (BP Location: Left Arm)   Pulse 72   Temp 97.9 F (36.6 C) (Oral)   Resp 18   Ht 5\' 6"  (1.676 m)   Wt 87.6 kg   SpO2 99%   Breastfeeding? Unknown   BMI 31.18 kg/m   LABS:  No postpartum labs ordered by MD        Recent Labs    11/08/17 0752  WBC 9.6  HGB 11.6*  PLT 275               Blood type: --/--/A POS (09/18 0752)  Rubella: Immune (03/13 0000)                     I&O: Intake/Output      09/18 0701 - 09/19 0700 09/19 0701 - 09/20 0700   Urine (mL/kg/hr) 1100 (0.5)    Blood 475    Total Output 1575    Net -1575         Urine Occurrence 1 x 1 x               Physical Exam:             Alert and oriented X3  Abdomen: soft, non-tender, non-distended              Fundus: firm, non-tender, U-1  Perineum: ice pack for mild edema  Lochia: light  Extremities: trace pedal edema, no calf pain or tenderness    A: PPD # 1 SVD with 2nd degree repair   Doing well - stable status  P: Routine post partum orders  Anticipate DC home tomorrow  Marlinda Mikeanya Bailey CNM, MSN, [facility] 11/09/2017, 2:38 PM

## 2017-11-10 LAB — BIRTH TISSUE RECOVERY COLLECTION (PLACENTA DONATION)

## 2017-11-10 MED ORDER — IBUPROFEN 600 MG PO TABS: 600.0000 mg | ORAL_TABLET | Freq: Four times a day (QID) | ORAL | 0 refills | Status: DC

## 2017-11-10 NOTE — Lactation Note (Signed)
This note was copied from a baby's chart. Lactation Consultation Note  Patient Name: Stefanie Smith ZOXWR'UToday's Date: 11/10/2017 Reason for consult: Follow-up assessment;Early term 37-38.6wks;Infant weight loss  38 hours old early term female who is being exclusively BF by her mother, she's a P3 and experienced BF; she BF her last child for 6 months. Mom and baby are going home today, [facility], reviewed discharge instructions with dad first.   Let dad know that they should also document the feeding attempts, not just the actual feedings on baby's feeding diary. Dad voiced understanding, and he said they'll try to feed her again within an hour. When mom came out, revise discharge instructions again, reviewed engorgement prevention and treatment, treatment for sore nipples and red flags on when to call your baby's pediatrician.  Mom has a pump at home, she's aware of LC OP services and will contact PRN.  Maternal Data    Feeding Feeding Type: Breast Fed Length of feed: 20 min  Interventions Interventions: Breast feeding basics reviewed  Lactation Tools Discussed/Used     Consult Status Consult Status: Complete Date: 11/10/17 Follow-up type: Call as needed    Stefanie Smith 11/10/2017, 12:26 PM

## 2017-11-10 NOTE — Progress Notes (Signed)
PPD 2 SVD with 2nd degree repair  S:  Reports feeling well - no concerns or pain - ready to go home asap             Tolerating po/ No nausea or vomiting             Bleeding is light             Pain controlled with motrin             Up ad lib / ambulatory / voiding QS  Newborn Breast   O:    VS: BP 100/67 (BP Location: Left Arm)   Pulse 61   Temp 97.8 F (36.6 C) (Oral)   Resp 16   Ht 5\' 6"  (1.676 m)   Wt 87.6 kg   SpO2 99%   Breastfeeding? Unknown   BMI 31.18 kg/m               Physical Exam:             Alert and oriented X3  Abdomen: soft, non-tender, non-distended              Fundus: firm, non-tender, U-1  Perineum: no edema  Lochia: light  Extremities: trace pedal edema, no calf pain or tenderness  A: PPD # 2   Doing well - stable status  P: Routine post partum orders  DC home - WOB instructions  Stefanie Smith CNM, MSN, [facility] 11/10/2017, 8:41 AM

## 2017-11-10 NOTE — Discharge Summary (Signed)
Obstetric Discharge Summary Reason for Admission: induction of labor - ICP Prenatal Procedures: NST, ultrasound and labs and treatment of ICP with Actigal Intrapartum Procedures: pitocin induction,  spontaneous vaginal delivery and epidural Postpartum Procedures: none Complications-Operative and Postpartum: 2nd degree perineal laceration Hemoglobin  Date Value Ref Range Status  11/08/2017 11.6 (L) 12.0 - 15.0 g/dL Final   HCT  Date Value Ref Range Status  11/08/2017 34.7 (L) 36.0 - 46.0 % Final    Physical Exam:  General: alert, cooperative and no distress Lochia: appropriate Uterine Fundus: firm Incision: healing well DVT Evaluation: No evidence of DVT seen on physical exam.  Discharge Diagnoses: Term Pregnancy-delivered  Discharge Information: Date: 11/10/2017 Activity: pelvic rest Diet: routine Medications: PNV, Ibuprofen and  FLU vaccine given PP Condition: stable Instructions: refer to practice specific booklet Discharge to: home Follow-up Information    Noland FordyceFogleman, Kelly, MD. Schedule an appointment as soon as possible for a visit in 6 week(s).   Specialty:  Obstetrics and Gynecology Contact information: [address] [place] [id number] [phone]           Newborn Data: Live born female  Birth Weight: 7 lb 4.1 oz (3290 g) APGAR: 8, 9  Newborn Delivery   Birth date/time:  11/08/2017 22:21:00 Delivery type:  Vaginal, Spontaneous     Home with mother.  Stefanie Smith 11/10/2017, 9:20 AM

## 2017-11-23 ENCOUNTER — Inpatient Hospital Stay (HOSPITAL_COMMUNITY)
Admission: AD | Admit: 2017-11-23 | Payer: Federal, State, Local not specified - PPO | Source: Ambulatory Visit | Admitting: Obstetrics

## 2017-11-27 ENCOUNTER — Encounter: Payer: Self-pay | Admitting: Family Medicine

## 2017-11-27 ENCOUNTER — Ambulatory Visit: Payer: Federal, State, Local not specified - PPO | Admitting: Family Medicine

## 2017-11-27 VITALS — BP 90/60 | HR 72 | Temp 98.0°F | Resp 16 | Ht 66.0 in | Wt 171.1 lb

## 2017-11-27 DIAGNOSIS — J3089 Other allergic rhinitis: Secondary | ICD-10-CM | POA: Diagnosis not present

## 2017-11-27 DIAGNOSIS — T7800XD Anaphylactic reaction due to unspecified food, subsequent encounter: Secondary | ICD-10-CM

## 2017-11-27 DIAGNOSIS — J4520 Mild intermittent asthma, uncomplicated: Secondary | ICD-10-CM

## 2017-11-27 NOTE — Progress Notes (Signed)
[address] [place] [address] Dept: [phone]  FOLLOW UP NOTE  Patient ID: Stefanie Smith, female    DOB: December 27, 1990  Age: 27 y.o. MRN: [id number] Date of Office Visit: 11/27/2017  Assessment  Chief Complaint: Allergic Rhinitis   HPI Stefanie Smith presents for follow up visit for allergic rhinitis, asthma and food allergies.   Allergic rhinitis: Currently on xyzal 5mg  daily with good benefit. Also takes nasonex 1 spray twice a day as needed during the spring time with good benefit. No conjunctivitis symptoms this past spring. Does get some nose bleeds at times.   Patient just had a baby 3 weeks ago. This is her third baby and doing well.   Asthma: Patient has cat induced asthma and does not have any cats at home. Some of her family members have cats at home and that's when her asthma flares when she goes visits them but they do not live close to the family.  Otherwise denies any respiratory symptoms. No ER/urgent care visits or prednisone use since the last year.  Last albuterol use was about a few years ago.  Food allergy: Currently avoiding tree nuts, sesame seeds and coconut.  Patient had an anaphylactic reaction in April 2018. Patient had tacos and chocolate but not sure if there was any cross contamination. Went to the ER and symptoms resolved without Epipen administration.  No accidental ingestion since then and no allergic reaction.  Patient has AuviQ at home and did not have to use it.  Drug Allergies:  Allergies  Allergen Reactions  . Other Anaphylaxis    Tree nuts, coconut, sesame seeds   . Amoxicillin Hives and Other (See Comments)    Has patient had a PCN reaction causing immediate rash, facial/tongue/throat swelling, SOB or lightheadedness with hypotension: No Has patient had a PCN reaction causing severe rash involving mucus membranes or skin necrosis: No Has patient had a PCN reaction that required hospitalization No Has patient had a PCN  reaction occurring within the last 10 years: No If all of the above answers are "NO", then may proceed with Cephalosporin use.    Review of Systems  Constitutional: Positive for unexpected weight change (just had baby 3 weeks ago). Negative for appetite change, chills and fever.  HENT: Negative for congestion and rhinorrhea.   Eyes: Negative for discharge and itching.  Respiratory: Negative for cough, shortness of breath and wheezing.   Gastrointestinal: Negative for abdominal pain.  Genitourinary: Negative for difficulty urinating.  Skin: Negative for rash.   Physical Exam: BP 90/60 (BP Location: Left Arm, Patient Position: Sitting, Cuff Size: Normal)   Pulse 72   Temp 98 F (36.7 C) (Oral)   Resp 16   Ht 5\' 6"  (1.676 m)   Wt 171 lb 1.2 oz (77.6 kg)   SpO2 99%   BMI 27.61 kg/m    Physical Exam  Constitutional: She appears well-developed and well-nourished.  HENT:  Head: Atraumatic.  Right Ear: External ear normal.  Left Ear: External ear normal.  Nose: Nose normal.  Mouth/Throat: Oropharynx is clear and moist.  Eyes: Conjunctivae and EOM are normal.  Cardiovascular: Normal rate, regular rhythm and normal heart sounds. Exam reveals no gallop and no friction rub.  No murmur heard. Pulmonary/Chest: Effort normal and breath sounds normal. No respiratory distress. She has no wheezes. She has no rales.  Skin: Skin is warm. No rash noted.  Psychiatric: She has a normal mood and affect. Her behavior is normal.  Nursing note and vitals  reviewed.  Diagnostics: Spirometry FVC 3.81 L, FEV1 3.52 L, predicted FVC 5.04 L, predicted FEV1 3.41 L. Interpretation: normal spirometry and normal effort.   Assessment and Plan: 1. Seasonal and perennial allergic rhinitis   2. Mild intermittent asthma without complication   3. Anaphylactic shock due to food, subsequent encounter        Patient Instructions  Seasonal and perennial allergic rhinitis  Continue appropriate allergen  avoidance measures.  Continue levocetirizine 5mg  daily.  May use Nasonex nasal spray as needed. If nose bleeds persist let us know. Demonstrated proper use.  If allergen avoidance measures and medications fail to adequately relieve symptoms, aeroallergen immunotherapy will be considered.  Asthma  Continue avoidance of aeroallergen triggers and have access to albuterol HFA, 1-2 inhalations every 4-6 hours if needed.  Food allergy  Continue careful avoidance of tree nuts, coconut and sesame seeds and have access to epinephrine autoinjector 2 pack in case of accidental ingestion.  May use Benadryl as needed for milder reactions.  Food allergy action plan given.   Return in about 1 year (around 11/28/2018).    Thank you for the opportunity to care for this patient.  Please do not hesitate to contact me with questions.  Tonette Bihari, M.D.  [facility] [address] [place], [place] [address] [phone]

## 2017-11-27 NOTE — Patient Instructions (Addendum)
Seasonal and perennial allergic rhinitis  Continue appropriate allergen avoidance measures.  Continue levocetirizine 5mg  daily.  May use Nasonex nasal spray as needed. If nose bleeds persist let us know. Demonstrated proper use.  If allergen avoidance measures and medications fail to adequately relieve symptoms, aeroallergen immunotherapy will be considered.  Asthma  Continue avoidance of aeroallergen triggers and have access to albuterol HFA, 1-2 inhalations every 4-6 hours if needed.  Food allergy  Continue careful avoidance of tree nuts, coconut and sesame seeds and have access to epinephrine autoinjector 2 pack in case of accidental ingestion.  May use Benadryl as needed for milder reactions.  Food allergy action plan given.

## 2017-12-27 DIAGNOSIS — Z3202 Encounter for pregnancy test, result negative: Secondary | ICD-10-CM | POA: Diagnosis not present

## 2017-12-27 DIAGNOSIS — Z30430 Encounter for insertion of intrauterine contraceptive device: Secondary | ICD-10-CM | POA: Diagnosis not present

## 2018-01-31 DIAGNOSIS — Z30431 Encounter for routine checking of intrauterine contraceptive device: Secondary | ICD-10-CM | POA: Diagnosis not present

## 2018-04-16 ENCOUNTER — Other Ambulatory Visit: Payer: Self-pay | Admitting: Allergy and Immunology

## 2018-04-16 DIAGNOSIS — H1013 Acute atopic conjunctivitis, bilateral: Secondary | ICD-10-CM

## 2018-04-16 DIAGNOSIS — J3089 Other allergic rhinitis: Secondary | ICD-10-CM

## 2018-04-26 DIAGNOSIS — K080 Exfoliation of teeth due to systemic causes: Secondary | ICD-10-CM | POA: Diagnosis not present

## 2018-06-28 IMAGING — US US ABDOMEN COMPLETE
1 series · 15 of 25 positions shown · non-contrast
Comparison: No prior.

CLINICAL DATA: Cholestasis.  Pregnancy.

EXAM:
ABDOMEN ULTRASOUND COMPLETE

[Series 1: us abdomen complete · 15 of 66 slices shown]
[im 1/66]
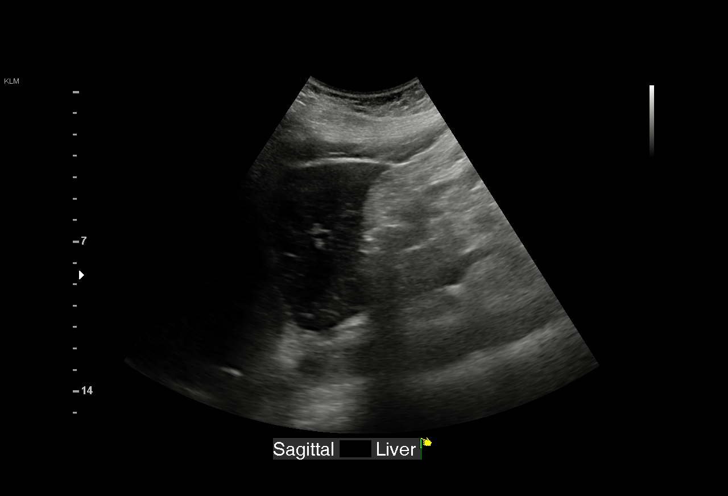
[im 6/66]
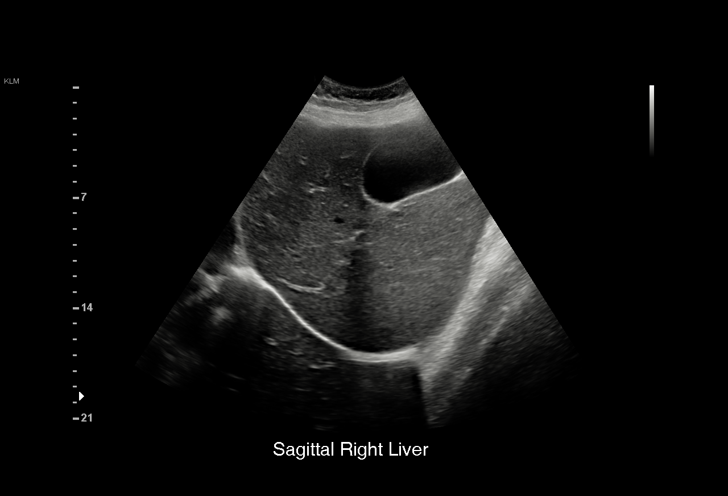
[im 11/66]
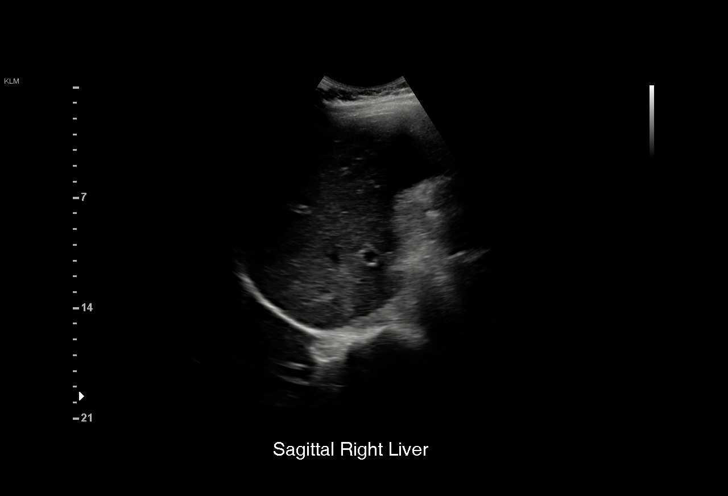
[im 14/66]
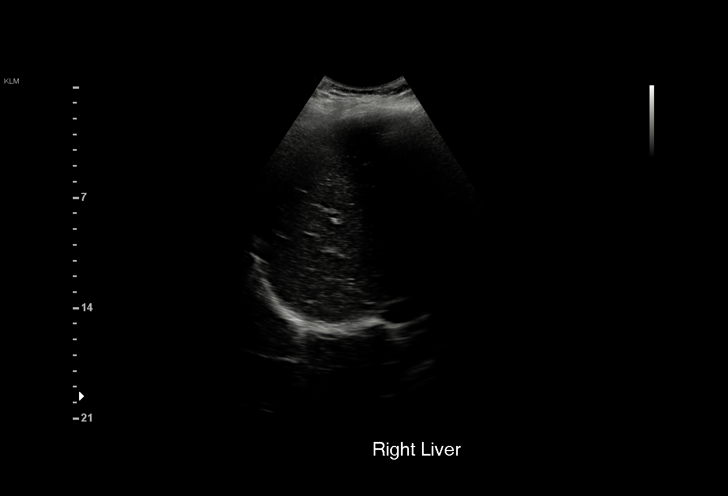
[im 19/66]
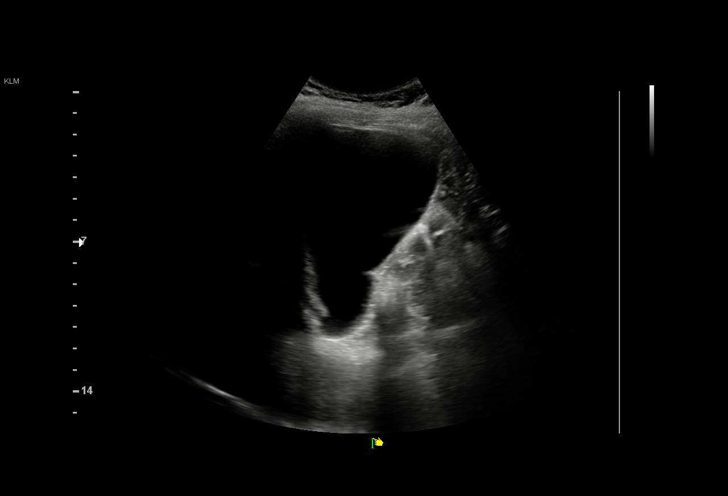
[im 25/66]
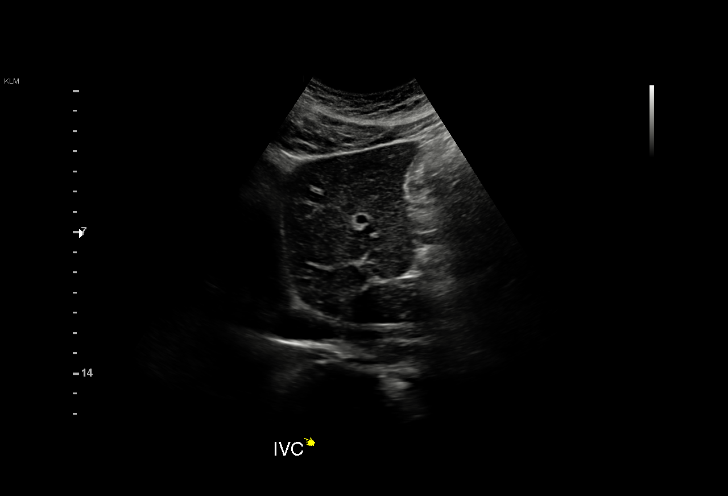
[im 28/66]
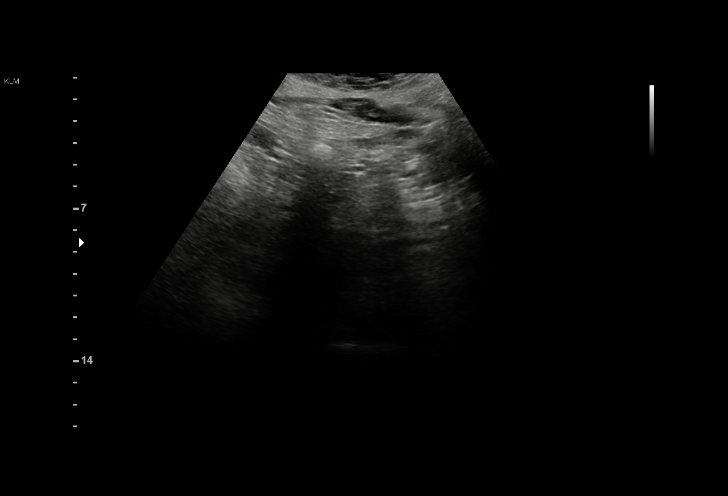
[im 33/66]
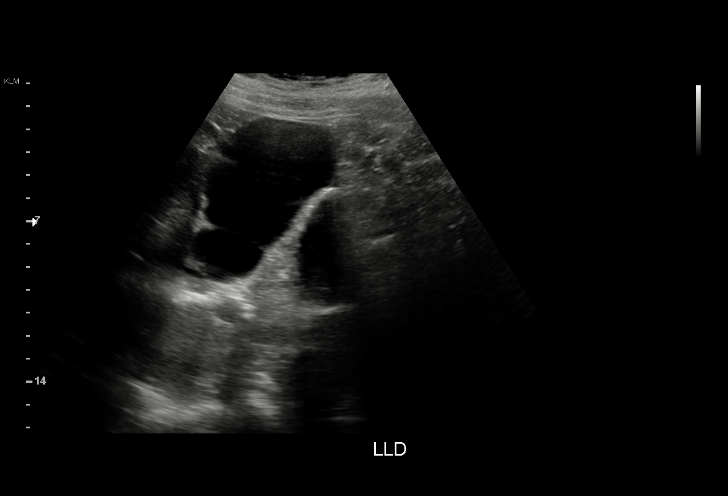
[im 38/66]
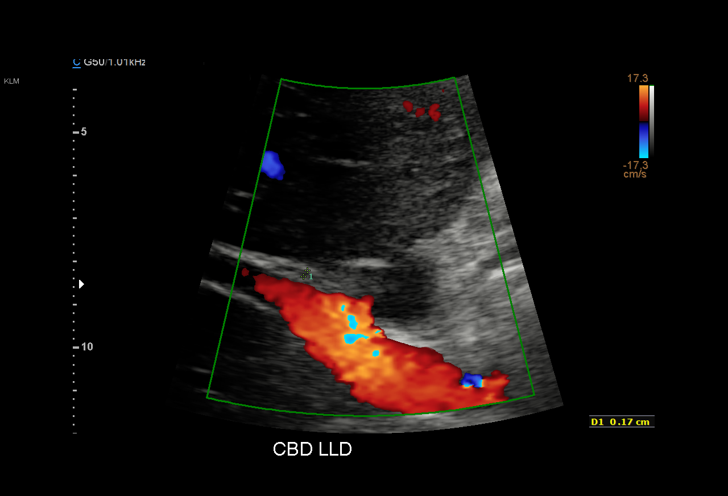
[im 41/66]
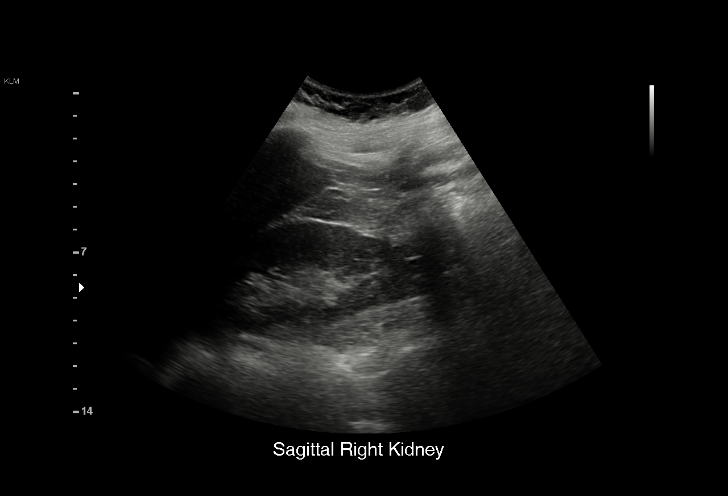
[im 47/66]
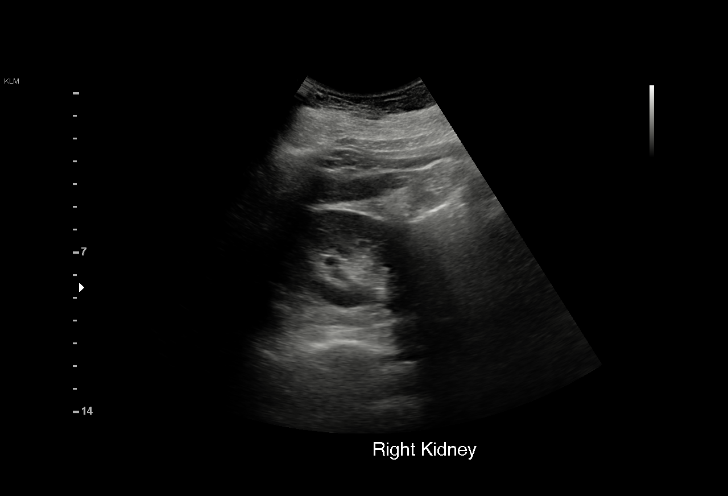
[im 52/66]
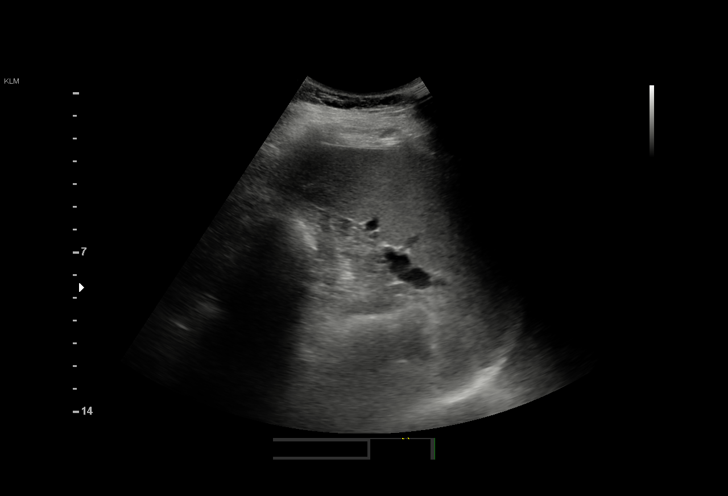
[im 55/66]
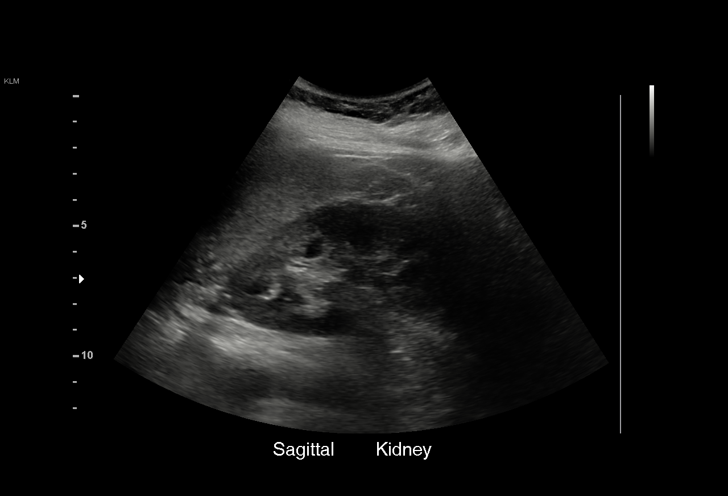
[im 60/66]
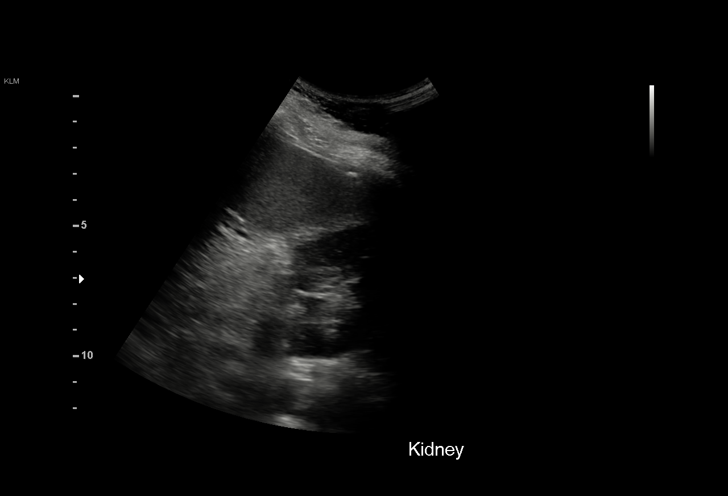
[im 66/66]
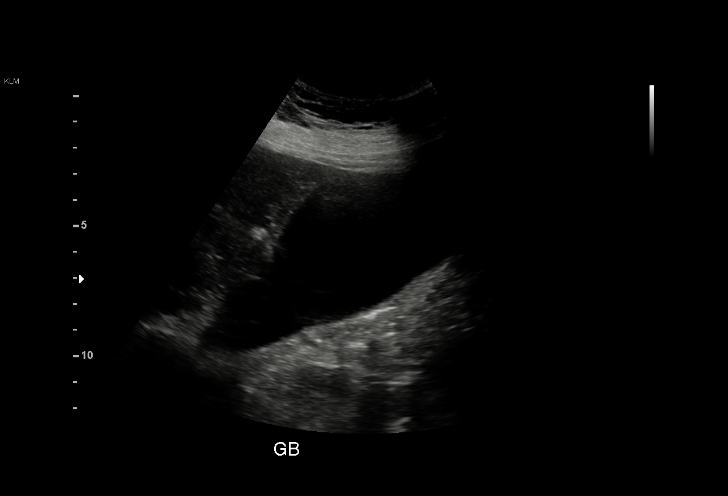

[15 of 25 positions shown; findings below may reference images not displayed]

FINDINGS: Gallbladder: Gallbladder slightly distended. No gallstones noted.
Negative Murphy sign. Gallbladder wall thickness 1.9 mm.

Common bile duct: Diameter: 1.9 mm

Liver: No focal lesion identified. Within normal limits in
parenchymal echogenicity.

IVC: No abnormality visualized.

Pancreas: Visualized portion unremarkable.

Spleen: Size and appearance within normal limits.

Right Kidney: Length: 10.7 cm. Echogenicity within normal limits. No
mass or hydronephrosis visualized.

Left Kidney: Length: 10.9 cm. Echogenicity within normal limits. No
mass or hydronephrosis visualized.

Abdominal aorta: No aneurysm visualized.

Other findings: Technically difficult exam due to patient's
pregnancy.
IMPRESSION: Technically difficult exam due to patient's pregnancy. Gallbladder
slightly distended. No gallstones. Negative Murphy sign. No
gallbladder wall thickening. Common bile duct normal in caliber at
1.9 mm. No focal hepatic abnormality .

## 2018-07-02 ENCOUNTER — Other Ambulatory Visit: Payer: Self-pay | Admitting: *Deleted

## 2018-07-02 DIAGNOSIS — T7800XD Anaphylactic reaction due to unspecified food, subsequent encounter: Secondary | ICD-10-CM

## 2018-07-02 MED ORDER — EPINEPHRINE 0.3 MG/0.3ML IJ SOAJ: INTRAMUSCULAR | 1 refills | Status: DC

## 2018-07-14 IMAGING — US US MFM FETAL BPP W/O NON-STRESS
1 series · 12 of 17 positions shown · non-contrast
Comparison: none

[Series 1: us mfm fetal bpp w/o non-stress · 17 acquisitions, 12 frames shown]
[im 1/17]
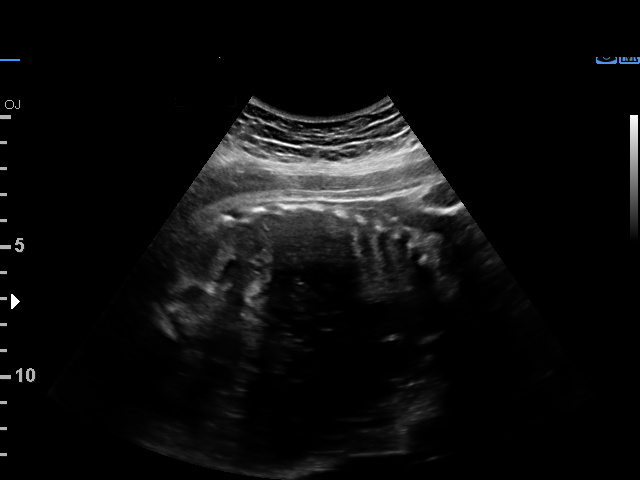
[im 3/17]
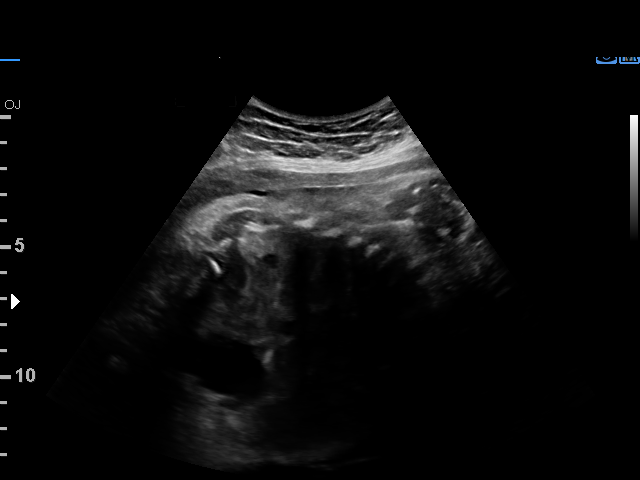
[im 4/17]
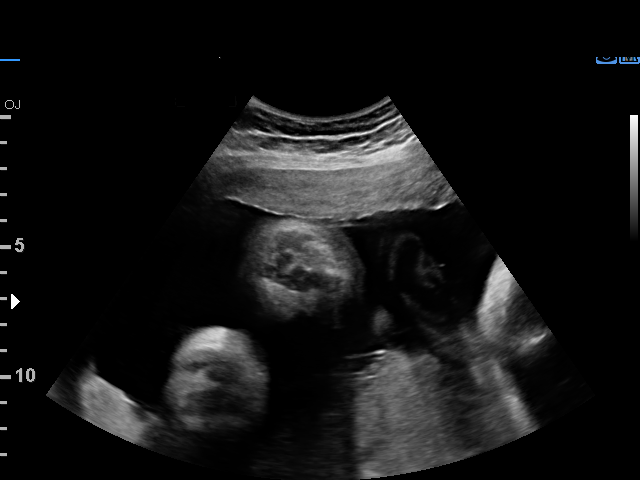
[im 5/17]
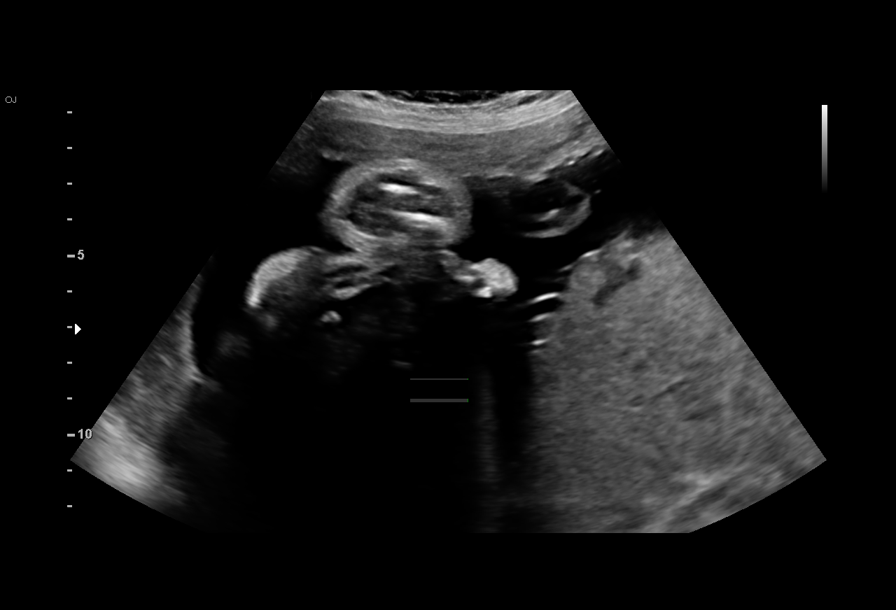
[im 7/17]
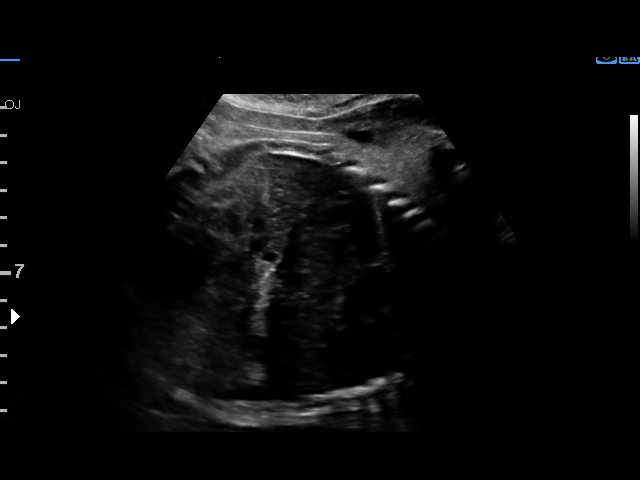
[im 8/17]
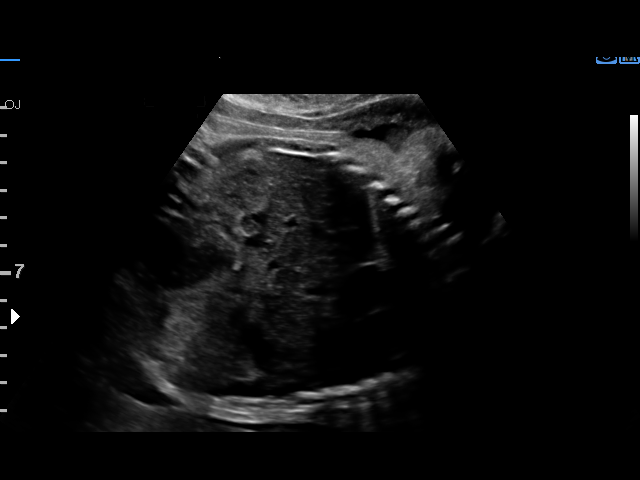
[im 10/17]
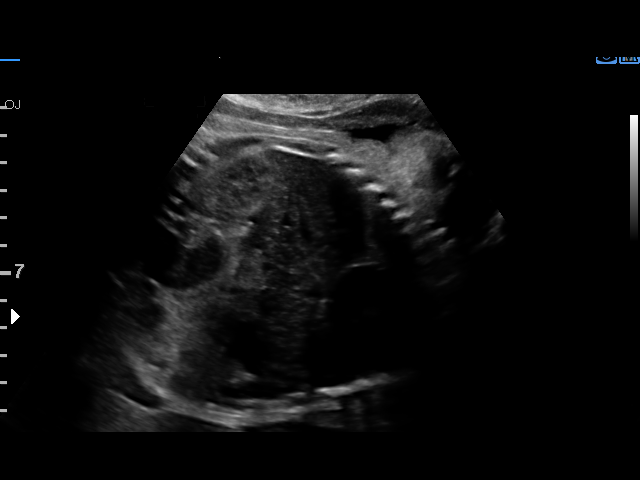
[im 11/17]
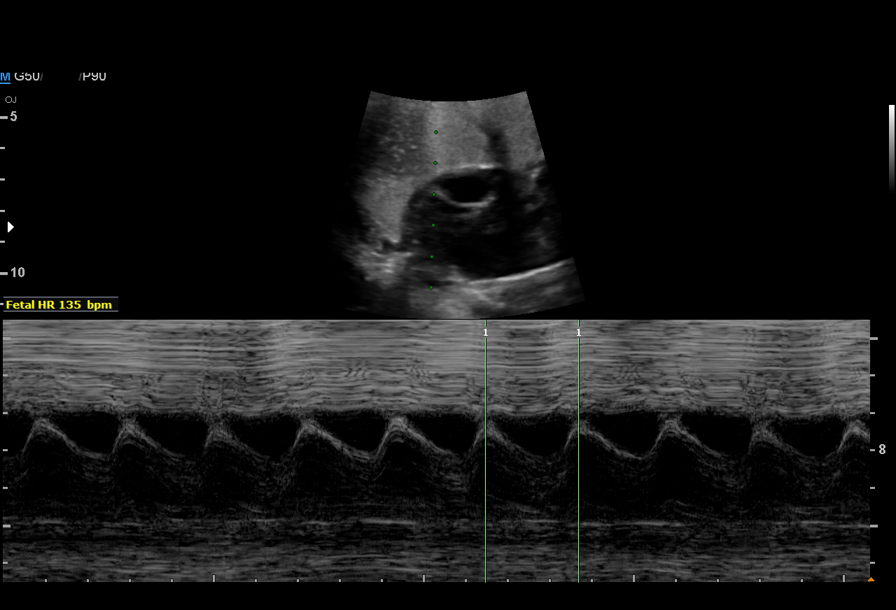
[im 13/17]
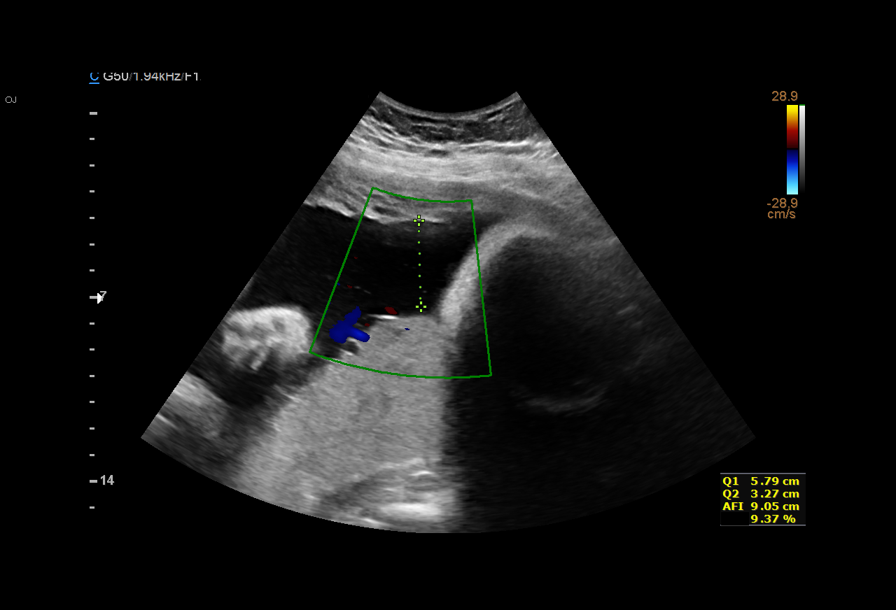
[im 14/17]
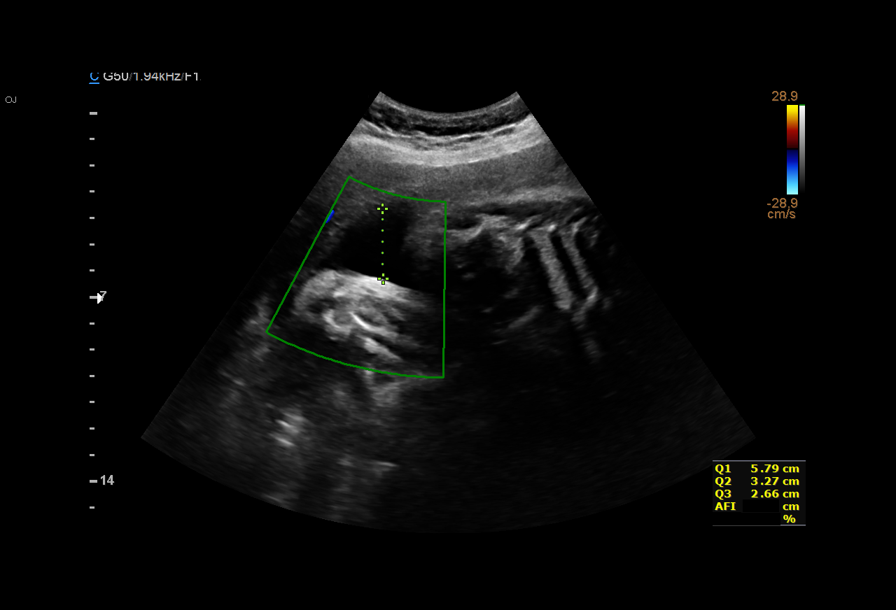
[im 15/17]
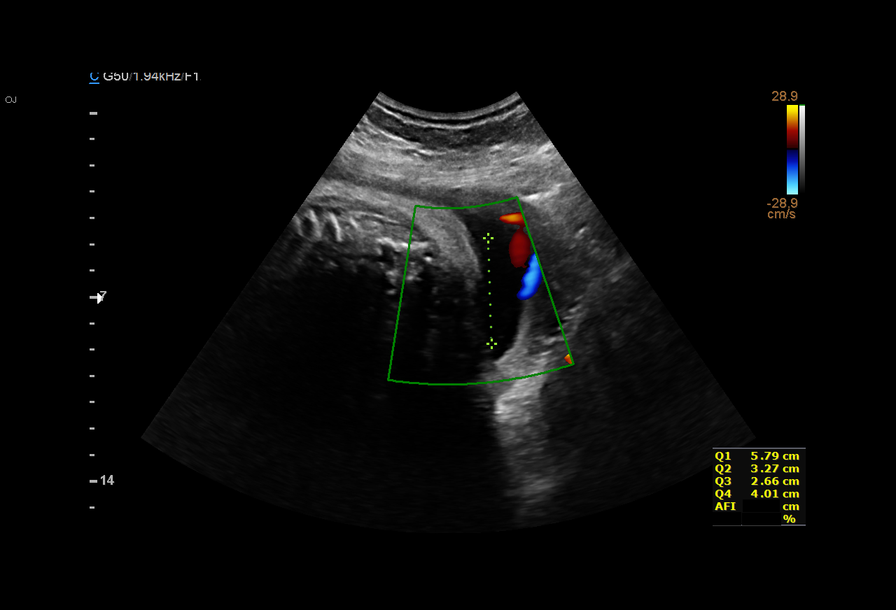
[im 17/17]
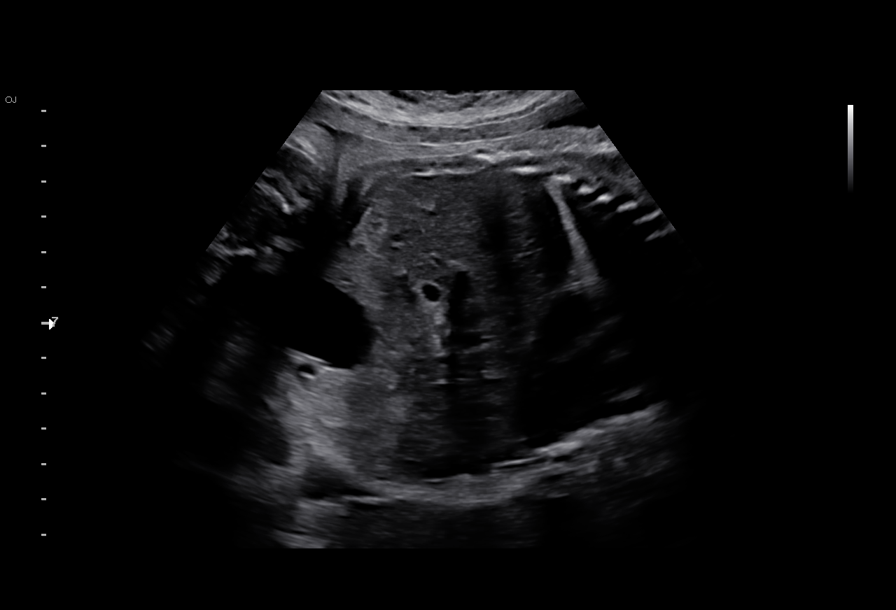

[12 of 17 positions shown; findings below may reference images not displayed]

OB/GYN &
Infertility
[id number] [HOSPITAL]

1  ABIMELK TIGER        [id number]      [id number]     [id number]
Indications

35 weeks gestation of pregnancy
Cholestasis of pregnancy, third trimester      [id number]
OB History

Gravidity:    2         Term:   1
Living:       1
Fetal Evaluation

Num Of Fetuses:     1
Fetal Heart         135
Rate(bpm):
Cardiac Activity:   Observed
Presentation:       Cephalic

Amniotic Fluid
AFI FV:      Subjectively within normal limits

AFI Sum(cm)     %Tile       Largest Pocket(cm)
15.73           57

RUQ(cm)       RLQ(cm)       LUQ(cm)        LLQ(cm)
5.79
Biophysical Evaluation
Amniotic F.V:   Within normal limits       F. Tone:        Observed
F. Movement:    Observed                   Score:          [DATE]
F. Breathing:   Observed
Gestational Age

Clinical EDD:  35w 2d                                        EDD:   12/09/15
Best:          35w 2d    Det. By:   Clinical EDD             EDD:   12/09/15
Impression

Single IUP at 35w 2d
Cholestasis of pregnancy
Cephalic presentation
BPP [DATE]
Normal amniotic fluid volume
Recommendations

Continue antenatal fetal testing
Delivery by 37 weeks gestation

## 2018-08-14 ENCOUNTER — Other Ambulatory Visit: Payer: Self-pay | Admitting: Allergy and Immunology

## 2018-08-14 DIAGNOSIS — H1013 Acute atopic conjunctivitis, bilateral: Secondary | ICD-10-CM

## 2018-08-14 DIAGNOSIS — J3089 Other allergic rhinitis: Secondary | ICD-10-CM

## 2018-10-02 DIAGNOSIS — R102 Pelvic and perineal pain: Secondary | ICD-10-CM | POA: Diagnosis not present

## 2018-11-22 ENCOUNTER — Other Ambulatory Visit: Payer: Self-pay | Admitting: Family Medicine

## 2018-11-22 DIAGNOSIS — H1013 Acute atopic conjunctivitis, bilateral: Secondary | ICD-10-CM

## 2018-11-22 DIAGNOSIS — J3089 Other allergic rhinitis: Secondary | ICD-10-CM

## 2019-01-16 ENCOUNTER — Other Ambulatory Visit: Payer: Self-pay | Admitting: Family Medicine

## 2019-01-16 DIAGNOSIS — H1013 Acute atopic conjunctivitis, bilateral: Secondary | ICD-10-CM

## 2019-01-16 DIAGNOSIS — J3089 Other allergic rhinitis: Secondary | ICD-10-CM

## 2019-01-24 ENCOUNTER — Other Ambulatory Visit: Payer: Self-pay

## 2019-01-24 ENCOUNTER — Encounter: Payer: Self-pay | Admitting: Family Medicine

## 2019-01-24 ENCOUNTER — Ambulatory Visit: Payer: Federal, State, Local not specified - PPO | Admitting: Family Medicine

## 2019-01-24 VITALS — BP 118/70 | HR 84 | Temp 97.2°F | Resp 16 | Ht 66.0 in | Wt 183.0 lb

## 2019-01-24 DIAGNOSIS — J3089 Other allergic rhinitis: Secondary | ICD-10-CM | POA: Diagnosis not present

## 2019-01-24 DIAGNOSIS — R040 Epistaxis: Secondary | ICD-10-CM | POA: Diagnosis not present

## 2019-01-24 DIAGNOSIS — T7800XD Anaphylactic reaction due to unspecified food, subsequent encounter: Secondary | ICD-10-CM

## 2019-01-24 DIAGNOSIS — J4520 Mild intermittent asthma, uncomplicated: Secondary | ICD-10-CM

## 2019-01-24 MED ORDER — PAZEO 0.7 % OP SOLN: 1.0000 [drp] | OPHTHALMIC | 4 refills | Status: DC

## 2019-01-24 MED ORDER — CETIRIZINE HCL 10 MG PO TABS: 10.0000 mg | ORAL_TABLET | Freq: Every day | ORAL | 5 refills | Status: DC | PRN

## 2019-01-24 NOTE — Patient Instructions (Signed)
Seasonal and perennial allergic rhinitis Stop levocetirizine (Xyzal) 5mg  daily and begin cetirizine 10 mg once a day as needed for a runny nose or itch. Remember to rotate to a different antihistamine about every 3-6 months. Some examples of over the counter antihistamines include Zyrtec (cetirizine), Xyzal (levocetirizine), Allegra (fexofenadine), and Claritin (loratidine).  Continue Nasonex nasal spray 1-2 sprays in each nostril once a day as needed for a stuffy nose.  In the right nostril, point the applicator out toward the right ear. In the left nostril, point the applicator out toward the left ear Consider saline nasal rinses as needed for nasal symptoms. Use this before any medicated nasal sprays for best result If allergen avoidance measures and medications fail to adequately relieve symptoms, aeroallergen immunotherapy may be considered.  Allergic conjunctivitis Begin Pazeo eye drops one drop in each eye once a day as needed for red, itchy eyes  Asthma Continue albuterol 2 puffs every 4 hours as needed Continue avoidance of aeroallergen triggers, specifically cats  Food allergy Continue careful avoidance of tree nuts, coconut and sesame seeds. In case of an allergic reaction, take Benadryl 50 mg every 4 hours, and if life-threatening symptoms occur, inject with AuviQ 0.3 mg.  Epistaxis Pinch both nostrils while leaning forward for at least 5 minutes before checking to see if the bleeding has stopped. If bleeding is not controlled within 5-10 minutes apply a cotton ball soaked with oxymetazoline (Afrin) to the bleeding nostril for a few seconds.   Call the clinic if this treatment plan is not working well for you  Follow up in 1 year or sooner if needed.

## 2019-01-24 NOTE — Progress Notes (Addendum)
[address] [place] [address] Dept: [phone]  FOLLOW UP NOTE  Patient ID: Stefanie Smith, female    DOB: Jun 26, 1990  Age: 28 y.o. MRN: [id number] Date of Office Visit: 01/24/2019  Assessment  Chief Complaint: Allergic Rhinitis  (doing pretty good. she is unsure if her allergy meds are working anymore as she is having some breakthrough symptoms. )  HPI Stefanie Smith is a 28 year old female who presents to the clinic for a follow up visit. She was last seen in this clinic on 11/28/2018 for evaluation of asthma, allergic rhinitis, allergic conjunctivitis, epistaxis, and food allergy to tree nut, sesame, and coconut. At today's visit, she reports that her allergic rhinitis has been moderately well controlled with symptoms including clear rhinorrhea, nasal congestion, and sneezing occurring more frequently over the last few months. She reports she has been taking Xyzal once a day for the last 2 years which has worked well with the exception of the last few months. She continues Nasonex as needed with some relief of symptoms. She reports experiencing a nose bleed about once every other month that, when the nares are pinched, stops in under 5 minutes. These nosebleeds do not seem to be related to the times when she has used Nasonex. Allergic conjunctivitis is reported as not well controlled with red, itchy eyed for which she is not currently using any eye drops. Asthma is reported as well controlled, with the exception of when she is near cats. She reports with exposure to cats she begins to wheeze and have shortness of breath which is resolved with albuterol and cat avoidance. She continues to avoid tree nuts, sesame, and coconut with no accidental ingestion no epinephrine use since her last visit to this clinic. Her current medications are listed in the chart.    Drug Allergies:  Allergies  Allergen Reactions  . Other Anaphylaxis    Tree nuts, coconut, sesame seeds   .  Amoxicillin Hives and Other (See Comments)    Has patient had a PCN reaction causing immediate rash, facial/tongue/throat swelling, SOB or lightheadedness with hypotension: No Has patient had a PCN reaction causing severe rash involving mucus membranes or skin necrosis: No Has patient had a PCN reaction that required hospitalization No Has patient had a PCN reaction occurring within the last 10 years: No If all of the above answers are "NO", then may proceed with Cephalosporin use.     Physical Exam: BP 118/70 (BP Location: Right Arm, Patient Position: Sitting, Cuff Size: Normal)   Pulse 84   Temp (!) 97.2 F (36.2 C) (Temporal)   Resp 16   Ht 5\' 6"  (1.676 m)   Wt 183 lb (83 kg)   SpO2 98%   BMI 29.54 kg/m    Physical Exam Vitals signs reviewed.  Constitutional:      Appearance: Normal appearance.  HENT:     Head: Normocephalic and atraumatic.     Right Ear: Tympanic membrane normal.     Left Ear: Tympanic membrane normal.     Nose:     Comments: Bilateral nares slightly erythematous with no nasal drainage noted. Pharynx normal. Ears normal. Eyes normal.    Mouth/Throat:     Pharynx: Oropharynx is clear.  Eyes:     Conjunctiva/sclera: Conjunctivae normal.  Neck:     Musculoskeletal: Normal range of motion and neck supple.  Cardiovascular:     Rate and Rhythm: Normal rate and regular rhythm.     Heart sounds: Normal heart  sounds. No murmur.  Pulmonary:     Effort: Pulmonary effort is normal.     Breath sounds: Normal breath sounds.     Comments: Lungs clear to auscultation Musculoskeletal: Normal range of motion.  Skin:    General: Skin is warm and dry.  Neurological:     Mental Status: She is alert and oriented to person, place, and time.  Psychiatric:        Mood and Affect: Mood normal.        Behavior: Behavior normal.        Thought Content: Thought content normal.        Judgment: Judgment normal.     Diagnostics: FVC 3.65, FEV1 3.39. Predicted FVC 4.04,  predicted FEV1 3.40. Spirometry indicates normal ventilatory function.    Assessment and Plan: 1. Mild intermittent asthma without complication   2. Seasonal and perennial allergic rhinitis   3. Anaphylactic shock due to food, subsequent encounter     Meds ordered this encounter  Medications  . cetirizine (ZYRTEC) 10 MG tablet    Sig: Take 1 tablet (10 mg total) by mouth daily as needed for allergies.    Dispense:  31 tablet    Refill:  5  . Olopatadine HCl (PAZEO) 0.7 % SOLN    Sig: Place 1 drop into both eyes 1 day or 1 dose.    Dispense:  2.5 mL    Refill:  4    Patient Instructions  Seasonal and perennial allergic rhinitis Stop levocetirizine (Xyzal) 5mg  daily and begin cetirizine 10 mg once a day as needed for a runny nose or itch. Remember to rotate to a different antihistamine about every 3-6 months. Some examples of over the counter antihistamines include Zyrtec (cetirizine), Xyzal (levocetirizine), Allegra (fexofenadine), and Claritin (loratidine).  Continue Nasonex nasal spray 1-2 sprays in each nostril once a day as needed for a stuffy nose.  In the right nostril, point the applicator out toward the right ear. In the left nostril, point the applicator out toward the left ear Consider saline nasal rinses as needed for nasal symptoms. Use this before any medicated nasal sprays for best result If allergen avoidance measures and medications fail to adequately relieve symptoms, aeroallergen immunotherapy may be considered.  Allergic conjunctivitis Begin Pazeo eye drops one drop in each eye once a day as needed for red, itchy eyes  Asthma Continue albuterol 2 puffs every 4 hours as needed Continue avoidance of aeroallergen triggers, specifically cats  Food allergy Continue careful avoidance of tree nuts, coconut and sesame seeds. In case of an allergic reaction, take Benadryl 50 mg every 4 hours, and if life-threatening symptoms occur, inject with AuviQ 0.3 mg.  Epistaxis  Pinch both nostrils while leaning forward for at least 5 minutes before checking to see if the bleeding has stopped. If bleeding is not controlled within 5-10 minutes apply a cotton ball soaked with oxymetazoline (Afrin) to the bleeding nostril for a few seconds.   Call the clinic if this treatment plan is not working well for you  Follow up in 1 year or sooner if needed.   Return in about 1 year (around 01/24/2020), or if symptoms worsen or fail to improve.    Thank you for the opportunity to care for this patient.  Please do not hesitate to contact me with questions.  Gareth Morgan, FNP [facility]  ________________________________________________  I have provided oversight concerning [facility]'s evaluation and treatment of this patient's health  issues addressed during today's encounter.  I agree with the assessment and therapeutic plan as outlined in the note.   Signed,   R Jorene Guestarter Bobbitt, MD

## 2019-01-25 NOTE — Addendum Note (Signed)
Addended by: Katherina Right D on: 01/25/2019 09:16 AM   Modules accepted: Orders

## 2019-06-13 DIAGNOSIS — Z1151 Encounter for screening for human papillomavirus (HPV): Secondary | ICD-10-CM | POA: Diagnosis not present

## 2019-06-13 DIAGNOSIS — R635 Abnormal weight gain: Secondary | ICD-10-CM | POA: Diagnosis not present

## 2019-06-13 DIAGNOSIS — Z01419 Encounter for gynecological examination (general) (routine) without abnormal findings: Secondary | ICD-10-CM | POA: Diagnosis not present

## 2019-06-13 DIAGNOSIS — Z13220 Encounter for screening for lipoid disorders: Secondary | ICD-10-CM | POA: Diagnosis not present

## 2019-06-13 DIAGNOSIS — Z6830 Body mass index (BMI) 30.0-30.9, adult: Secondary | ICD-10-CM | POA: Diagnosis not present

## 2019-06-13 DIAGNOSIS — R5383 Other fatigue: Secondary | ICD-10-CM | POA: Diagnosis not present

## 2019-08-02 ENCOUNTER — Other Ambulatory Visit: Payer: Self-pay | Admitting: Family Medicine

## 2019-08-06 ENCOUNTER — Other Ambulatory Visit: Payer: Self-pay

## 2019-08-06 DIAGNOSIS — T7800XD Anaphylactic reaction due to unspecified food, subsequent encounter: Secondary | ICD-10-CM

## 2019-08-06 MED ORDER — EPINEPHRINE 0.3 MG/0.3ML IJ SOAJ: INTRAMUSCULAR | 1 refills | Status: DC

## 2019-11-28 ENCOUNTER — Other Ambulatory Visit: Payer: Self-pay

## 2019-11-28 ENCOUNTER — Encounter: Payer: Self-pay | Admitting: Allergy & Immunology

## 2019-11-28 ENCOUNTER — Ambulatory Visit: Payer: Federal, State, Local not specified - PPO | Admitting: Allergy & Immunology

## 2019-11-28 VITALS — BP 130/70 | HR 60 | Temp 97.9°F | Resp 18 | Wt 187.4 lb

## 2019-11-28 DIAGNOSIS — T7800XD Anaphylactic reaction due to unspecified food, subsequent encounter: Secondary | ICD-10-CM | POA: Diagnosis not present

## 2019-11-28 DIAGNOSIS — J3089 Other allergic rhinitis: Secondary | ICD-10-CM | POA: Diagnosis not present

## 2019-11-28 DIAGNOSIS — J302 Other seasonal allergic rhinitis: Secondary | ICD-10-CM | POA: Diagnosis not present

## 2019-11-28 MED ORDER — AZELASTINE HCL 0.1 % NA SOLN: 1.0000 | Freq: Every day | NASAL | 5 refills | Status: DC

## 2019-11-28 MED ORDER — FLUTICASONE PROPIONATE 50 MCG/ACT NA SUSP: 1.0000 | Freq: Every day | NASAL | 5 refills | Status: DC

## 2019-11-28 NOTE — Progress Notes (Signed)
FOLLOW UP  Date of Service/Encounter:  11/28/19   Assessment:   Seasonal and perennial allergic rhinitis (grasses, weeds, trees, indoor and outdoor molds, dust mite, cat, dog) - poorly controlled  Anaphylaxis to food (tree nuts, coconut, sesame)  Plan/Recommendations:   1. Seasonal and perennial allergic rhinitis - Continue with alternating antihistamines as you are doing. - I would focus on using the fluticasone (Flonase) one spray per nostril EVERY day to stay ahead of the symptoms. - Add on azelastine (Astelin) one spray per nostril daily (can be used with the Flonase and can be used AS NEEDED). - You can use the Astelin every day, but it tastes terrible. - Consider allergy shots for long term control. - Check with your insurance company on coverage.   2. Return in about 6 months (around 05/28/2020).   Subjective:   Stefanie Smith is a 29 y.o. female presenting today for follow up of  Chief Complaint  Patient presents with  . Follow-up  . Asthma  . Allergies    zyrtec isn't working as well; still having runny nose, watery eyes, sneezing    Stefanie Smith has a history of the following: Patient Active Problem List   Diagnosis Date Noted  . Epistaxis 01/24/2019  . Postpartum care following vaginal delivery (9/18) 11/09/2017  . Encounter for planned induction of labor 11/08/2017  . Anaphylactic shock due to adverse food reaction 07/14/2016  . Seasonal and perennial allergic rhinitis 07/14/2016  . Allergic conjunctivitis 07/14/2016  . Mild intermittent asthma without complication 07/14/2016  . Allergic reaction 07/14/2016    History obtained from: chart review and patient.  Stefanie Smith is a 29 y.o. female presenting for a follow up visit.  She was last seen in December 2020 by Stefanie Smith our nurse practitioner.  At that time, her Xyzal was stopped and she was started on cetirizine 10 mg daily.  She was encouraged to rotate to different antihistamines every 3 to 6  months.  She was continued on Nasonex 1 to 2 sprays per nostril daily as well as nasal saline rinses.  She was also started on Pazeo 1 drop per eye daily as needed for red itchy eyes.  For her asthma, she was continued with 2 puffs every 4 hours as needed.  She was encouraged to continue to avoid tree nuts, coconut, and sesame seed.  Her EpiPen was up-to-date.  Since last visit, she has mostly done well. She was originally switching between antihistamines every 3-6 months. Fall and spring are thw worst time of the year. She has itchy watery eyes and congestion and rhinorrhea. She does have an intranasal steroid but does not use it frequently. Seasonally things get worse. Thre are no other medications at this time.  She has never been on allergen immunotherapy.  There have been no changes to her daily life.  There are no new animals.  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  She continues to avoid tree nuts, coconut, and sesame seeds.  She has not had any accidental exposures.  She does have an up-to-date EpiPen.  She is not interested in retesting.  Otherwise, there have been no changes to her past medical history, surgical history, family history, or social history.    Review of Systems  Constitutional: Negative.  Negative for chills, fever, malaise/fatigue and weight loss.  HENT: Positive for congestion and sinus pain. Negative for ear discharge and ear pain.   Eyes: Negative for pain, discharge and redness.  Respiratory: Negative for cough, sputum production, shortness of breath and wheezing.   Cardiovascular: Negative.  Negative for chest pain and palpitations.  Gastrointestinal: Negative for abdominal pain, constipation, diarrhea, heartburn, nausea and vomiting.  Skin: Negative.  Negative for itching and rash.  Neurological: Negative for dizziness and headaches.  Endo/Heme/Allergies: Positive for environmental allergies. Does not bruise/bleed easily.        Objective:   Blood pressure 130/70, pulse 60, temperature 97.9 F (36.6 C), temperature source Temporal, resp. rate 18, weight 187 lb 6.4 oz (85 kg), SpO2 100 %, unknown if currently breastfeeding. Body mass index is 30.25 kg/m.   Physical Exam:  Physical Exam Constitutional:      Appearance: She is well-developed.  HENT:     Head: Normocephalic and atraumatic.     Right Ear: Tympanic membrane, ear canal and external ear normal.     Left Ear: Tympanic membrane, ear canal and external ear normal.     Nose: No nasal deformity, septal deviation, mucosal edema or rhinorrhea.     Right Turbinates: Enlarged, swollen and pale.     Left Turbinates: Enlarged, swollen and pale.     Right Sinus: No maxillary sinus tenderness or frontal sinus tenderness.     Left Sinus: No maxillary sinus tenderness or frontal sinus tenderness.     Mouth/Throat:     Mouth: Mucous membranes are not pale and not dry.     Pharynx: Uvula midline.  Eyes:     General:        Right eye: No discharge.        Left eye: No discharge.     Conjunctiva/sclera: Conjunctivae normal.     Right eye: Right conjunctiva is not injected. No chemosis.    Left eye: Left conjunctiva is not injected. No chemosis.    Pupils: Pupils are equal, round, and reactive to light.  Cardiovascular:     Rate and Rhythm: Normal rate and regular rhythm.     Heart sounds: Normal heart sounds.  Pulmonary:     Effort: Pulmonary effort is normal. No tachypnea, accessory muscle usage or respiratory distress.     Breath sounds: Normal breath sounds. No wheezing, rhonchi or rales.  Chest:     Chest wall: No tenderness.  Lymphadenopathy:     Cervical: No cervical adenopathy.  Skin:    Coloration: Skin is not pale.     Findings: No abrasion, erythema, petechiae or rash. Rash is not papular, urticarial or vesicular.  Neurological:     Mental Status: She is alert.      Diagnostic studies: none      Malachi Bonds, MD  [facility]

## 2019-11-28 NOTE — Patient Instructions (Addendum)
1. Seasonal and perennial allergic rhinitis - Continue with alternating antihistamines as you are doing. - I would focus on using the fluticasone (Flonase) one spray per nostril EVERY day to stay ahead of the symptoms. - Add on azelastine (Astelin) one spray per nostril daily (can be used with the Flonase and can be used AS NEEDED). - You can use the Astelin every day, but it tastes terrible. - Consider allergy shots for long term control. - Check with your insurance company on coverage.   2. Return in about 6 months (around 05/28/2020).    Please inform us of any Emergency Department visits, hospitalizations, or changes in symptoms. Call us before going to the ED for breathing or allergy symptoms since we might be able to fit you in for a sick visit. Feel free to contact us anytime with any questions, problems, or concerns.  It was a pleasure to meet you today!  Websites that have reliable patient information: 1. American Academy of Asthma, Allergy, and Immunology: www.aaaai.org 2. Food Allergy Research and Education (FARE): foodallergy.org 3. Mothers of Asthmatics: http://www.asthmacommunitynetwork.org 4. American College of Allergy, Asthma, and Immunology: www.acaai.org   COVID-19 Vaccine Information can be found at: [email or web address].nl For questions related to vaccine distribution or appointments, please email [email or web address] or call [phone].     "Like" [place] on Facebook and Instagram for our latest updates!     HAPPY FALL!     Make sure you are registered to vote! If you have moved or changed any of your contact information, you will need to get this updated before voting!  In some cases, you MAY be able to register to vote online: [email or web address]     Allergy Shots   Allergies are the result of a chain reaction that starts in the immune system. Your immune system controls  how your body defends itself. For instance, if you have an allergy to pollen, your immune system identifies pollen as an invader or allergen. Your immune system overreacts by producing antibodies called Immunoglobulin E (IgE). These antibodies travel to cells that release chemicals, causing an allergic reaction.  The concept behind allergy immunotherapy, whether it is received in the form of shots or tablets, is that the immune system can be desensitized to specific allergens that trigger allergy symptoms. Although it requires time and patience, the payback can be long-term relief.  How Do Allergy Shots Work?  Allergy shots work much like a vaccine. Your body responds to injected amounts of a particular allergen given in increasing doses, eventually developing a resistance and tolerance to it. Allergy shots can lead to decreased, minimal or no allergy symptoms.  There generally are two phases: build-up and maintenance. Build-up often ranges from three to six months and involves receiving injections with increasing amounts of the allergens. The shots are typically given once or twice a week, though more rapid build-up schedules are sometimes used.  The maintenance phase begins when the most effective dose is reached. This dose is different for each person, depending on how allergic you are and your response to the build-up injections. Once the maintenance dose is reached, there are longer periods between injections, typically two to four weeks.  Occasionally doctors give cortisone-type shots that can temporarily reduce allergy symptoms. These types of shots are different and should not be confused with allergy immunotherapy shots.  Who Can Be Treated with Allergy Shots?  Allergy shots may be a good treatment approach for people with allergic rhinitis (hay fever), allergic asthma, conjunctivitis (eye allergy)  or stinging insect allergy.   Before deciding to begin allergy shots, you should  consider:  . The length of allergy season and the severity of your symptoms . Whether medications and/or changes to your environment can control your symptoms . Your desire to avoid long-term medication use . Time: allergy immunotherapy requires a major time commitment . Cost: may vary depending on your insurance coverage  Allergy shots for children age 69 and older are effective and often well tolerated. They might prevent the onset of new allergen sensitivities or the progression to asthma.  Allergy shots are not started on patients who are pregnant but can be continued on patients who become pregnant while receiving them. In some patients with other medical conditions or who take certain common medications, allergy shots may be of risk. It is important to mention other medications you talk to your allergist.   When Will I Feel Better?  Some may experience decreased allergy symptoms during the build-up phase. For others, it may take as long as 12 months on the maintenance dose. If there is no improvement after a year of maintenance, your allergist will discuss other treatment options with you.  If you aren't responding to allergy shots, it may be because there is not enough dose of the allergen in your vaccine or there are missing allergens that were not identified during your allergy testing. Other reasons could be that there are high levels of the allergen in your environment or major exposure to non-allergic triggers like tobacco smoke.  What Is the Length of Treatment?  Once the maintenance dose is reached, allergy shots are generally continued for three to five years. The decision to stop should be discussed with your allergist at that time. Some people may experience a permanent reduction of allergy symptoms. Others may relapse and a longer course of allergy shots can be considered.  What Are the Possible Reactions?  The two types of adverse reactions that can occur with allergy  shots are local and systemic. Common local reactions include very mild redness and swelling at the injection site, which can happen immediately or several hours after. A systemic reaction, which is less common, affects the entire body or a particular body system. They are usually mild and typically respond quickly to medications. Signs include increased allergy symptoms such as sneezing, a stuffy nose or hives.  Rarely, a serious systemic reaction called anaphylaxis can develop. Symptoms include swelling in the throat, wheezing, a feeling of tightness in the chest, nausea or dizziness. Most serious systemic reactions develop within 30 minutes of allergy shots. This is why it is strongly recommended you wait in your doctor's office for 30 minutes after your injections. Your allergist is trained to watch for reactions, and his or her staff is trained and equipped with the proper medications to identify and treat them.  Who Should Administer Allergy Shots?  The preferred location for receiving shots is your prescribing allergist's office. Injections can sometimes be given at another facility where the physician and staff are trained to recognize and treat reactions, and have received instructions by your prescribing allergist.

## 2020-02-20 ENCOUNTER — Other Ambulatory Visit: Payer: Self-pay | Admitting: Family Medicine

## 2020-05-28 ENCOUNTER — Ambulatory Visit: Payer: Federal, State, Local not specified - PPO | Admitting: Allergy & Immunology

## 2020-06-26 ENCOUNTER — Ambulatory Visit: Payer: Federal, State, Local not specified - PPO | Admitting: Allergy and Immunology

## 2020-06-26 ENCOUNTER — Encounter: Payer: Self-pay | Admitting: Allergy and Immunology

## 2020-06-26 ENCOUNTER — Other Ambulatory Visit: Payer: Self-pay

## 2020-06-26 VITALS — BP 102/78 | HR 74 | Resp 16

## 2020-06-26 DIAGNOSIS — J3089 Other allergic rhinitis: Secondary | ICD-10-CM | POA: Diagnosis not present

## 2020-06-26 DIAGNOSIS — J302 Other seasonal allergic rhinitis: Secondary | ICD-10-CM

## 2020-06-26 DIAGNOSIS — Z8709 Personal history of other diseases of the respiratory system: Secondary | ICD-10-CM | POA: Diagnosis not present

## 2020-06-26 DIAGNOSIS — T7800XD Anaphylactic reaction due to unspecified food, subsequent encounter: Secondary | ICD-10-CM

## 2020-06-26 NOTE — Progress Notes (Signed)
[place] - [place] [facility] [place] [facility] [place] - [place]   Follow-up Note  Referring Provider: Sharmon Leyden, MD Primary Provider: Sharmon Leyden, MD Date of Office Visit: 06/26/2020  Subjective:   Stefanie Smith (DOB: [birth year]) is a 30 y.o. female who [facility] 06[facility]/2020 in re-evaluation of the following:  HPI: Stefanie Smith returns to this clinic in evaluation of allergic rhinitis and history of anaphylaxis to foods including tree nuts and coconut and sesame and a very distant history of asthma.  Her last visit to this clinic was 28 November 2019.  Her allergies are under very good control as long as she continues to use nasal fluticasone and nasal azelastine usually 1 time per day and continues to use Zyrtec.  It does not sound as though she has required a systemic steroid or antibiotic for any type of airway issue.  She remains away from consumption of tree nuts, coconut, and sesame.  She does have an injectable epinephrine device.  Her distant history of asthma appears to be tied up with cat exposure.  She has only had wheezing and coughing on occasions when she gets exposed to cat.  She does not have any exercise-induced bronchospastic symptoms or cold air induced bronchospastic symptoms and has not used a short acting bronchodilator in years.  She does not feel that she needs a albuterol inhaler.  Allergies as of 06/26/2020      Reactions   Other Anaphylaxis   Tree nuts, coconut, sesame seeds    Amoxicillin Hives, Other (See Comments)   Has patient had a PCN reaction causing immediate rash, facial/tongue/throat swelling, SOB or lightheadedness with hypotension: No Has patient had a PCN reaction causing severe rash involving mucus membranes or skin necrosis: No Has patient had a PCN reaction that required hospitalization No Has patient had a PCN reaction occurring within the last 10 years: No If all of the above answers are "NO", then may  proceed with Cephalosporin use.      Medication List    azelastine 0.1 % nasal spray Commonly known as: ASTELIN Place 1 spray into both nostrils daily.   cetirizine 10 MG tablet Commonly known as: ZYRTEC TAKE 1 TABLET BY MOUTH DAILY AS NEEDED FOR ALLERGIES   EPINEPHrine 0.3 mg/0.3 mL Soaj injection Commonly known as: Auvi-Q Use as directed for severe allergic reactions   fluticasone 50 MCG/ACT nasal spray Commonly known as: FLONASE Place 1 spray into both nostrils daily.       Past Medical History:  Diagnosis Date  . Cholelithiases    pregnancy  . Cholestasis of pregnancy   . Postpartum care following vaginal delivery (9/25) 11/16/2015    Past Surgical History:  Procedure Laterality Date  . TONSILLECTOMY    . WISDOM TOOTH EXTRACTION      Review of systems negative except as noted in HPI / PMHx or noted below:  Review of Systems  Constitutional: Negative.   HENT: Negative.   Eyes: Negative.   Respiratory: Negative.   Cardiovascular: Negative.   Gastrointestinal: Negative.   Genitourinary: Negative.   Musculoskeletal: Negative.   Skin: Negative.   Neurological: Negative.   Endo/Heme/Allergies: Negative.   Psychiatric/Behavioral: Negative.      Objective:   Vitals:   06/26/20 0843  BP: 102/78  Pulse: 74  Resp: 16          Physical Exam Constitutional:      Appearance: She is not diaphoretic.  HENT:  Head: Normocephalic.     Right Ear: Tympanic membrane, ear canal and external ear normal.     Left Ear: Tympanic membrane, ear canal and external ear normal.     Nose: Nose normal. No mucosal edema or rhinorrhea.     Mouth/Throat:     Pharynx: Uvula midline. No oropharyngeal exudate.  Eyes:     Conjunctiva/sclera: Conjunctivae normal.  Neck:     Thyroid: No thyromegaly.     Trachea: Trachea normal. No tracheal tenderness or tracheal deviation.  Cardiovascular:     Rate and Rhythm: Normal rate and regular rhythm.     Heart sounds: Normal  heart sounds, S1 normal and S2 normal. No murmur heard.   Pulmonary:     Effort: No respiratory distress.     Breath sounds: Normal breath sounds. No stridor. No wheezing or rales.  Lymphadenopathy:     Head:     Right side of head: No tonsillar adenopathy.     Left side of head: No tonsillar adenopathy.     Cervical: No cervical adenopathy.  Skin:    Findings: No erythema or rash.     Nails: There is no clubbing.  Neurological:     Mental Status: She is alert.     Diagnostics: none  Assessment and Plan:   1. Seasonal and perennial allergic rhinitis   2. Anaphylactic shock due to food, subsequent encounter   3. History of asthma     1.  Continue Flonase -1 spray each nostril 1-2 times per day depending on disease activity  2.  Continue azelastine -1 spray each nostril 1-2 times per day depending on disease activity  3.  If needed:    A. OTC cetirizine 10 mg -1 tablet 1 time per day  B. Auvi-Q 0.3  4.  Future need for albuterol inhaler???  5.  Return to clinic in 1 year or earlier if problem  Stefanie Smith appears to be doing very well on her current therapy and she can continue on Flonase and azelastine and a oral antihistamine if needed.  Of course, she is going to remain away from consumption of tree nut, coconut, and sesame.  Her distant history of asthma is very and inactive and she is not very interested in obtaining an albuterol inhaler at this point.  We will see her back in this clinic in 1 year or earlier if there is a problem.  Laurette Schimke, MD Allergy / Immunology [facility]

## 2020-06-26 NOTE — Patient Instructions (Addendum)
  1.  Continue Flonase -1 spray each nostril 1-2 times per day depending on disease activity  2.  Continue azelastine -1 spray each nostril 1-2 times per day depending on disease activity  3.  If needed:    A. OTC cetirizine 10 mg -1 tablet 1 time per day  B. Auvi-Q 0.3  4.  Future need for albuterol inhaler???  5.  Return to clinic in 1 year or earlier if problem

## 2020-06-29 ENCOUNTER — Encounter: Payer: Self-pay | Admitting: Allergy and Immunology

## 2020-09-21 ENCOUNTER — Other Ambulatory Visit: Payer: Self-pay

## 2020-09-21 MED ORDER — CETIRIZINE HCL 10 MG PO TABS: ORAL_TABLET | ORAL | 5 refills | Status: AC

## 2020-12-18 ENCOUNTER — Other Ambulatory Visit: Payer: Self-pay | Admitting: *Deleted

## 2020-12-18 DIAGNOSIS — T7800XD Anaphylactic reaction due to unspecified food, subsequent encounter: Secondary | ICD-10-CM

## 2020-12-18 MED ORDER — EPINEPHRINE 0.3 MG/0.3ML IJ SOAJ: INTRAMUSCULAR | 1 refills | Status: DC

## 2021-08-19 NOTE — Progress Notes (Signed)
FOLLOW UP Date of Service/Encounter:  08/20/21   Subjective:  Stefanie Smith (DOB: 1990/07/18) is a 31 y.o. female who [facility] on 08/20/2021 in re-evaluation of the following: allergic rhinitis and history of anaphylaxis to foods including tree nuts and coconut and sesame and a very distant history of asthma.   History obtained from: chart review and patient.  For Review, LV was on 06/26/2020 with Dr. Lucie Leather seen for routine follow-up.  Allergic rhinitis controlled with fluticasone and azelastine along with Zyrtec.  Avoiding tree nuts, coconut, sesame.  History of wheezing around cats.  Today presents for follow-up. Allergic rhinitis: She uses azelastine and flonase at the change of each season.  Every day takes cetirizine because if she doesn't take it she starts getting hives.  She has had hives for a long time.  That is how she knows to change her antihistamines.  She will start noticing hives on a more frequent basis and will switch antihistamine brands which seems to help control her symptoms.  Hives occur despite no consistent trigger  Food allergy:  She is avoiding coconut, tree nuts and sesame.  No accidental exposures.  She does have a history of wheezing around cats.  She is able to successfully avoid cats has not needed albuterol in a long time.  She is open to caring one in the event of accidental exposure  Allergies as of 08/20/2021       Reactions   Other Anaphylaxis   Tree nuts, coconut, sesame seeds    Amoxicillin Hives, Other (See Comments)   Has patient had a PCN reaction causing immediate rash, facial/tongue/throat swelling, SOB or lightheadedness with hypotension: No Has patient had a PCN reaction causing severe rash involving mucus membranes or skin necrosis: No Has patient had a PCN reaction that required hospitalization No Has patient had a PCN reaction occurring within the last 10 years: No If all of the above answers are "NO",  then may proceed with Cephalosporin use.        Medication List        Accurate as of August 20, 2021  5:15 PM. If you have any questions, ask your nurse or doctor.          azelastine 0.1 % nasal spray Commonly known as: ASTELIN Place 1 spray into both nostrils daily.   cetirizine 10 MG tablet Commonly known as: ZYRTEC TAKE 1 TABLET BY MOUTH DAILY AS NEEDED FOR ALLERGIES   EPINEPHrine 0.3 mg/0.3 mL Soaj injection Commonly known as: Auvi-Q Use as directed for severe allergic reactions   fluticasone 50 MCG/ACT nasal spray Commonly known as: FLONASE Place 1 spray into both nostrils daily.   levocetirizine 5 MG tablet Commonly known as: XYZAL Take 5 mg by mouth every evening.       Past Medical History:  Diagnosis Date   Cholelithiases    pregnancy   Cholestasis of pregnancy    Postpartum care following vaginal delivery (9/25) 11/16/2015   Past Surgical History:  Procedure Laterality Date   TONSILLECTOMY     WISDOM TOOTH EXTRACTION     Otherwise, there have been no changes to her past medical history, surgical history, family history, or social history.  ROS: All others negative except as noted per HPI.   Objective:  BP 110/70   Pulse (!) 50   Temp 99.9 F (37.7 C) (Temporal)   Resp 16   Ht 5\' 7"  (1.702 m)   Wt 194 lb 11.2  oz (88.3 kg)   SpO2 98%   BMI 30.49 kg/m  Body mass index is 30.49 kg/m. Physical Exam: General Appearance:  Alert, cooperative, no distress, appears stated age  Head:  Normocephalic, without obvious abnormality, atraumatic  Eyes:  Conjunctiva clear, EOM's intact  Nose: Nares normal, hypertrophic turbinates, normal mucosa, and no visible anterior polyps  Throat: Lips, tongue normal; teeth and gums normal, normal posterior oropharynx  Neck: Supple, symmetrical  Lungs:   clear to auscultation bilaterally, Respirations unlabored, no coughing  Heart:  regular rate and rhythm and no murmur, Appears well perfused  Extremities: No  edema  Skin: Skin color, texture, turgor normal, no rashes or lesions on visualized portions of skin  Neurologic: No gross deficits   Assessment/Plan  Stefanie Smith is a 31 year old female with allergic rhinitis controlled on daily levocetirizine with intermittent use of nasal sprays.  She has a history of wheezing when around cats, but is overall able to avoid.  We will send in a rescue inhaler so that she has it for emergencies.  Additionally discovered that she has been having chronic hives for many years, controlled with once daily antihistamines.  She will let us know if her hives become uncontrolled on this regimen.  1.  Continue Flonase -1 spray each nostril 1-2 times per day depending on disease activity  2.  Continue azelastine -1 spray each nostril 1-2 times per day depending on disease activity  3.  If needed:    A. OTC cetirizine 10 mg -1 tablet 1 time per day  B. Auvi-Q 0.3  4.  Albuterol inhaler- 2 puffs every 4 to 6 hours as needed.   5.  Return to clinic in 1 year or earlier if problem  Tonny Bollman, MD  [facility]

## 2021-08-20 ENCOUNTER — Encounter: Payer: Self-pay | Admitting: Internal Medicine

## 2021-08-20 ENCOUNTER — Ambulatory Visit: Payer: Federal, State, Local not specified - PPO | Admitting: Family

## 2021-08-20 ENCOUNTER — Ambulatory Visit: Payer: Federal, State, Local not specified - PPO | Admitting: Internal Medicine

## 2021-08-20 VITALS — BP 110/70 | HR 50 | Temp 99.9°F | Resp 16 | Ht 67.0 in | Wt 194.7 lb

## 2021-08-20 DIAGNOSIS — J3089 Other allergic rhinitis: Secondary | ICD-10-CM | POA: Diagnosis not present

## 2021-08-20 DIAGNOSIS — T7800XD Anaphylactic reaction due to unspecified food, subsequent encounter: Secondary | ICD-10-CM | POA: Diagnosis not present

## 2021-08-20 DIAGNOSIS — L508 Other urticaria: Secondary | ICD-10-CM | POA: Diagnosis not present

## 2021-08-20 DIAGNOSIS — Z8709 Personal history of other diseases of the respiratory system: Secondary | ICD-10-CM

## 2021-08-20 MED ORDER — LEVOCETIRIZINE DIHYDROCHLORIDE 5 MG PO TABS: 5.0000 mg | ORAL_TABLET | Freq: Every evening | ORAL | 11 refills | Status: AC

## 2021-08-20 MED ORDER — FLUTICASONE PROPIONATE 50 MCG/ACT NA SUSP: 1.0000 | Freq: Every day | NASAL | 5 refills | Status: DC

## 2021-08-20 MED ORDER — EPINEPHRINE 0.3 MG/0.3ML IJ SOAJ: 0.3000 mg | INTRAMUSCULAR | 1 refills | Status: DC | PRN

## 2021-08-20 MED ORDER — AZELASTINE HCL 0.1 % NA SOLN: 1.0000 | Freq: Every day | NASAL | 5 refills | Status: DC

## 2021-08-20 MED ORDER — ALBUTEROL SULFATE HFA 108 (90 BASE) MCG/ACT IN AERS: 2.0000 | INHALATION_SPRAY | RESPIRATORY_TRACT | 2 refills | Status: DC | PRN

## 2021-08-20 NOTE — Patient Instructions (Addendum)
  1.  Continue Flonase -1 spray each nostril 1-2 times per day depending on disease activity  2.  Continue azelastine -1 spray each nostril 1-2 times per day depending on disease activity  3.  If needed:    A. OTC cetirizine 10 mg -1 tablet 1 time per day  B. Auvi-Q 0.3  4.  Albuterol inhaler- 2 puffs every 4 to 6 hours as needed.   5.  Return to clinic in 1 year or earlier if problem

## 2022-08-23 NOTE — Progress Notes (Signed)
FOLLOW UP Date of Service/Encounter:  08/24/22   Subjective:  Stefanie Smith (DOB: 1990-04-06) is a 32 y.o. female [facility] in re-evaluation of the following:  allergic rhinitis and history of anaphylaxis to foods including tree nuts,coconut and sesame, chronic urticaria and remote history of asthma. History obtained from: chart review and patient.  For Review, LV was on 08/20/21  with Dr.Lakendria Nicastro seen for routine follow-up. See below for summary of history and diagnostics.  Therapeutic plans/changes recommended: doing well, reported history of hives controlled on daily anthistamine. No accidental food allergy exposures. No recent wheezing episodes.  ----------------------------------------------------- Pertinent History/Diagnostics:  Asthma: Remote history.  Has a history of wheezing around cats.  Has not required albuterol in years. Allergic Rhinitis:  Perennial nasal congestion, rhinorrhea, sneezing, postnasal drainage, nasal pruritus, and ocular pruritus  - 2018 SPT Positive to grass pollens, weed pollens, ragweed pollen, tree pollens, molds, and cat hair. Intradermal testing: Positive to dog epithelia and dust mite antigen. Food Allergy:  Hx of reactions: consumed beef taco's with lettuce and cheese as well as white chocolate candy. Approximately 1.5 to 2 hours after the meal she developed hives "all over", nausea, vomiting, diarrhea, and lightheadedness. Hypotensive in ED.  Avoids coconut, tree nuts, sesame following allergy testing. 2018 SPT Positive to walnut, almond, hazelnut, coconut, and sesame seed.  Urticaria:  Occurs daily if not taking 1 antihistamine.  No obvious triggers --------------------------------------------------- Today presents for follow-up. Since March she has been having chest tightness. Was told initially secondary to GERD.  She has been taking pepcid and omeprazole without relief.  She was then recommended  to go see GI and she had an endoscopy to rule out EoE.  Seen at [facility] gastroenterology and told that her endoscopy did not show EOE.  However, her gastroenterologist did stretch her esophagus and she was told her esophagus and stomach didn't look normal.  She was told to continue her reflux medications which she has been doing without relief. Her allergies have been controlled with xyzal and her nasal sprays. Denies drainage.   She does feel like her chest pressure feels like when she is around cat, so she tried her albuterol and this seems to be giving her significant relief.  She is using it often.  She does not have a spacer and has never been on a controller inhaler.   On review of her chart, has been seen multiple times for chest pain.  PCP thought secondary to GERD. She had a Holter monitor placed, echocardiogram. ED visit on 04.24.24 chest pain reported as center of chest and mid back. CXR obtained and normal. Occasional PVCs noted on EKG, no signs of acute ischemic damage. GERD again considered likely etiology. Given "GI cocktail" which reportedly alleviated her symptoms. Echo performed on 07/07/22 normal. Normal stress test on 07/07/22.    Chart Review.  Echo: 07/07/22: 07/07/2022 12:34 PM EDT  Left Ventricle: Left ventricle size is normal.   Left Ventricle: Systolic function is normal. EF: 55-60%.   Right Ventricle: Right ventricle size is normal.   Right Ventricle: Systolic function is normal.   Tricuspid Valve: The right ventricular systolic pressure is normal (<36 mmHg). Stress test: 07/07/2022 10:36 AM EDT  Exercised for 9 minutes on a standard protocol. Patient reached 94% of MAPHR, workload of 12.8 METS. Patient had normal hemodynamic response to exercise. Normal baseline EKG, nonsignificant ST changes with exercise. Patient had few PVCs during recovery. Conclusion: Stress test is negative  for demonstrate inducible ischemia  Allergies as of 08/24/2022       Reactions    Other Anaphylaxis   Tree nuts, coconut, sesame seeds    Amoxicillin Hives, Other (See Comments)   Has patient had a PCN reaction causing immediate rash, facial/tongue/throat swelling, SOB or lightheadedness with hypotension: No Has patient had a PCN reaction causing severe rash involving mucus membranes or skin necrosis: No Has patient had a PCN reaction that required hospitalization No Has patient had a PCN reaction occurring within the last 10 years: No If all of the above answers are "NO", then may proceed with Cephalosporin use.        Medication List        Accurate as of August 24, 2022 12:49 PM. If you have any questions, ask your nurse or doctor.          albuterol 108 (90 Base) MCG/ACT inhaler Commonly known as: VENTOLIN HFA Inhale 2 puffs into the lungs every 4 (four) hours as needed for wheezing or shortness of breath.   azelastine 0.1 % nasal spray Commonly known as: ASTELIN Place 1 spray into both nostrils daily.   cetirizine 10 MG tablet Commonly known as: ZYRTEC TAKE 1 TABLET BY MOUTH DAILY AS NEEDED FOR ALLERGIES   EPINEPHrine 0.3 mg/0.3 mL Soaj injection Commonly known as: Auvi-Q Use as directed for severe allergic reactions   EPINEPHrine 0.3 mg/0.3 mL Soaj injection Commonly known as: EpiPen 2-Pak Inject 0.3 mg into the muscle as needed for anaphylaxis.   famotidine 20 MG tablet Commonly known as: PEPCID Take 20 mg by mouth daily.   fluticasone 50 MCG/ACT nasal spray Commonly known as: FLONASE Place 1 spray into both nostrils daily.   levocetirizine 5 MG tablet Commonly known as: XYZAL Take 1 tablet (5 mg total) by mouth every evening.   pantoprazole 40 MG tablet Commonly known as: PROTONIX Take 40 mg by mouth daily.       Past Medical History:  Diagnosis Date   Cholelithiases    pregnancy   Cholestasis of pregnancy    Postpartum care following vaginal delivery (9/25) 11/16/2015   Past Surgical History:  Procedure Laterality Date    TONSILLECTOMY     WISDOM TOOTH EXTRACTION     Otherwise, there have been no changes to her past medical history, surgical history, family history, or social history.  ROS: All others negative except as noted per HPI.   Objective:  BP 120/80   Pulse 60   Temp 98.2 F (36.8 C) (Temporal)   Resp 12   Ht 5\' 5"  (1.651 m)   Wt 183 lb 1.6 oz (83.1 kg)   SpO2 99%   BMI 30.47 kg/m  Body mass index is 30.47 kg/m. Physical Exam: General Appearance:  Alert, cooperative, no distress, appears stated age  Head:  Normocephalic, without obvious abnormality, atraumatic  Eyes:  Conjunctiva clear, EOM's intact  Nose: Nares normal, hypertrophic turbinates, normal mucosa, and no visible anterior polyps  Throat: Lips, tongue normal; teeth and gums normal, normal posterior oropharynx  Neck: Supple, symmetrical  Lungs:   clear to auscultation bilaterally, Respirations unlabored, no coughing  Heart:  regular rate and rhythm and no murmur, Appears well perfused  Extremities: No edema  Skin: Skin color, texture, turgor normal and no rashes or lesions on visualized portions of skin  Neurologic: No gross deficits   Spirometry:  Tracings reviewed. Her effort: It was hard to get consistent efforts and there is a question as to whether  this reflects a maximal maneuver. FVC: 2.80L FEV1: 2.62L, 80% predicted FEV1/FVC ratio: 112% Interpretation: Spirometry consistent with possible restrictive disease. Nonobstructive ratio. Please see scanned spirometry results for details.  Assessment/Plan  Uncontrolled chest tightness for several months relieved with albuterol.  Discussed possible asthma.  Will start a controller inhaler and see if this is effective.  She has had extensive GI and cardiac workup which have been nondiagnostic except for potentially reflux.  However reports no relief with reflux medications.   Chest Pain improved with albuterol, uncontrolled asthma? Start Symbicort 80 mcg 2 puffs twice daily  with spacer. Rinse mouth out after use  Rest of plan as below: 1.  Continue Flonase -1 spray each nostril 1-2 times per day depending on disease activity  2.  Continue azelastine -1 spray each nostril 1-2 times per day depending on disease activity  3.  If needed:    A. OTC cetirizine 10 mg -1 tablet 1 time per day  B. Auvi-Q 0.3  4.  Albuterol inhaler- 2 puffs every 4 to 6 hours as needed.   5.  Return to clinic in 4-6 weeks or earlier if problem  Tonny Bollman, MD  [facility]

## 2022-08-24 ENCOUNTER — Encounter: Payer: Self-pay | Admitting: Internal Medicine

## 2022-08-24 ENCOUNTER — Other Ambulatory Visit: Payer: Self-pay

## 2022-08-24 ENCOUNTER — Ambulatory Visit: Payer: Federal, State, Local not specified - PPO | Admitting: Internal Medicine

## 2022-08-24 VITALS — BP 120/80 | HR 60 | Temp 98.2°F | Resp 12 | Ht 65.0 in | Wt 183.1 lb

## 2022-08-24 DIAGNOSIS — J302 Other seasonal allergic rhinitis: Secondary | ICD-10-CM

## 2022-08-24 DIAGNOSIS — Z8709 Personal history of other diseases of the respiratory system: Secondary | ICD-10-CM

## 2022-08-24 DIAGNOSIS — H1013 Acute atopic conjunctivitis, bilateral: Secondary | ICD-10-CM

## 2022-08-24 DIAGNOSIS — R0602 Shortness of breath: Secondary | ICD-10-CM

## 2022-08-24 DIAGNOSIS — J3089 Other allergic rhinitis: Secondary | ICD-10-CM

## 2022-08-24 DIAGNOSIS — L508 Other urticaria: Secondary | ICD-10-CM

## 2022-08-24 DIAGNOSIS — J4520 Mild intermittent asthma, uncomplicated: Secondary | ICD-10-CM

## 2022-08-24 MED ORDER — ALBUTEROL SULFATE HFA 108 (90 BASE) MCG/ACT IN AERS: 2.0000 | INHALATION_SPRAY | RESPIRATORY_TRACT | 2 refills | Status: DC | PRN

## 2022-08-24 MED ORDER — BUDESONIDE-FORMOTEROL FUMARATE 80-4.5 MCG/ACT IN AERO: 2.0000 | INHALATION_SPRAY | Freq: Two times a day (BID) | RESPIRATORY_TRACT | 3 refills | Status: DC

## 2022-08-24 MED ORDER — EPINEPHRINE 0.3 MG/0.3ML IJ SOAJ: 0.3000 mg | INTRAMUSCULAR | 1 refills | Status: AC | PRN

## 2022-08-24 NOTE — Patient Instructions (Signed)
Chest Pain improved with albuterol, uncontrolled asthma? Start Symbicort 80 mcg 2 puffs twice daily with spacer. Rinse mouth out after use  Rest of plan as below: 1.  Continue Flonase -1 spray each nostril 1-2 times per day depending on disease activity  2.  Continue azelastine -1 spray each nostril 1-2 times per day depending on disease activity  3.  If needed:    A. OTC cetirizine 10 mg -1 tablet 1 time per day  B. Auvi-Q 0.3  4.  Albuterol inhaler- 2 puffs every 4 to 6 hours as needed.   5.  Return to clinic in 4-6 weeks or earlier if problem

## 2022-09-27 NOTE — Progress Notes (Unsigned)
FOLLOW UP Date of Service/Encounter:  09/28/22  Subjective:  Stefanie Smith (DOB: 10-31-1990) is a 32 y.o. female who [facility] in re-evaluation of the following: allergic rhinitis and history of anaphylaxis to foods including tree nuts,coconut and sesame, chronic urticaria and remote history of asthma.  History obtained from: chart review and patient.  For Review, LV was on 08/24/22  with Dr.Masson Nalepa seen for routine follow-up. See below for summary of history and diagnostics.   Therapeutic plans/changes recommended: FEV1 80%. Having recurrent chest tightness for several months relieved with albuterol. Negative cardiac work-up and possible reflux, did not improve with reflux medications. Start: Symbicort 80 mcg 2 puffs BID Continue: flonase, azelastine, cetirizine PRN, AuviQ PRN, albuterol PRN ----------------------------------------------------- Pertinent History/Diagnostics:  Asthma: Remote history.  Has a history of wheezing around cats.  Has not required albuterol in years. Allergic Rhinitis:  Perennial nasal congestion, rhinorrhea, sneezing, postnasal drainage, nasal pruritus, and ocular pruritus  - 2018 SPT Positive to grass pollens, weed pollens, ragweed pollen, tree pollens, molds, and cat hair. Intradermal testing: Positive to dog epithelia and dust mite antigen. Food Allergy: Tree nuts, coconut, sesame. Hx of reactions: consumed beef taco's with lettuce and cheese as well as white chocolate candy. Approximately 1.5 to 2 hours after the meal she developed hives "all over", nausea, vomiting, diarrhea, and lightheadedness. Hypotensive in ED.  Avoids coconut, tree nuts, sesame following allergy testing. 2018 SPT Positive to walnut, almond, hazelnut, coconut, and sesame seed.  Urticaria:  Occurs daily if not taking 1 antihistamine.  No obvious triggers --------------------------------------------------- Today presents for  follow-up. After her last encounter, she started the Symbicort 2 puffs twice daily.  During the first 2 weeks of using this medication, was still requiring her albuterol inhaler on a frequent basis. However, she has not needed any rescue in the past two weeks.  She feels significantly improved since starting Symbicort. She does continue to take her medication for reflux, and her plan with gastroenterology is to continue this for 3 months and then follow-up.  Her reflux is also well-controlled at this time. She has not needed any of her nasal sprays, but would like a refill. She is otherwise doing very well.   All medications reviewed by clinical staff and updated in chart. No new pertinent medical or surgical history except as noted in HPI.  ROS: All others negative except as noted per HPI.   Objective:  BP 118/88   Pulse 70   Temp 97.9 F (36.6 C) (Temporal)   Resp 17   Ht 5\' 5"  (1.651 m)   Wt 181 lb 14.4 oz (82.5 kg)   SpO2 100%   BMI 30.27 kg/m  Body mass index is 30.27 kg/m. Physical Exam: General Appearance:  Alert, cooperative, no distress, appears stated age  Head:  Normocephalic, without obvious abnormality, atraumatic  Eyes:  Conjunctiva clear, EOM's intact  Ears EACs normal bilaterally and normal TMs bilaterally  Nose: Nares normal, hypertrophic turbinates, normal mucosa, and no visible anterior polyps  Throat: Lips, tongue normal; teeth and gums normal, normal posterior oropharynx  Neck: Supple, symmetrical  Lungs:   clear to auscultation bilaterally, Respirations unlabored, no coughing  Heart:  regular rate and rhythm and no murmur, Appears well perfused  Extremities: No edema  Skin: Skin color, texture, turgor normal and no rashes or lesions on visualized portions of skin  Neurologic: No gross deficits   Labs:  Lab Orders  No laboratory test(s) ordered today  Spirometry:  Tracings reviewed. Her effort: Good reproducible efforts. FVC: 3.44L FEV1: 3.18L,  98% predicted FEV1/FVC ratio: 110% Interpretation: Spirometry consistent with normal pattern.  Please see scanned spirometry results for details.   Assessment/Plan   Shortness of breath has resolved since starting Symbicort 80, 2 puffs twice daily.  Her spirometry also shows a significant improvement in her FEV1 since last visit.  Her reflux is also reportedly controlled.  We will continue medications as below until her 31-month follow-up.  Continue Symbicort 80 mcg 2 puffs twice daily with spacer. Rinse mouth out after use  Rest of plan as below: 1.  Continue Flonase -1 spray each nostril 1-2 times per day depending on disease activity  2.  Continue azelastine -1 spray each nostril 1-2 times per day depending on disease activity  3.  If needed:    A. OTC cetirizine 10 mg -1 tablet 1 time per day  B. Auvi-Q 0.3  4.  Albuterol inhaler- 2 puffs every 4 to 6 hours as needed.   5.  Return to clinic in 6 months or earlier if problem  Other:  None  Tonny Bollman, MD  [facility]

## 2022-09-28 ENCOUNTER — Ambulatory Visit: Payer: Federal, State, Local not specified - PPO | Admitting: Internal Medicine

## 2022-09-28 ENCOUNTER — Encounter: Payer: Self-pay | Admitting: Internal Medicine

## 2022-09-28 VITALS — BP 118/88 | HR 70 | Temp 97.9°F | Resp 17 | Ht 65.0 in | Wt 181.9 lb

## 2022-09-28 DIAGNOSIS — J4520 Mild intermittent asthma, uncomplicated: Secondary | ICD-10-CM

## 2022-09-28 DIAGNOSIS — T7800XD Anaphylactic reaction due to unspecified food, subsequent encounter: Secondary | ICD-10-CM | POA: Diagnosis not present

## 2022-09-28 DIAGNOSIS — L508 Other urticaria: Secondary | ICD-10-CM

## 2022-09-28 DIAGNOSIS — J3089 Other allergic rhinitis: Secondary | ICD-10-CM | POA: Diagnosis not present

## 2022-09-28 DIAGNOSIS — H1013 Acute atopic conjunctivitis, bilateral: Secondary | ICD-10-CM

## 2022-09-28 DIAGNOSIS — J302 Other seasonal allergic rhinitis: Secondary | ICD-10-CM

## 2022-09-28 MED ORDER — BUDESONIDE-FORMOTEROL FUMARATE 80-4.5 MCG/ACT IN AERO: 2.0000 | INHALATION_SPRAY | Freq: Two times a day (BID) | RESPIRATORY_TRACT | 5 refills | Status: DC

## 2022-09-28 MED ORDER — FLUTICASONE PROPIONATE 50 MCG/ACT NA SUSP: 1.0000 | Freq: Every day | NASAL | 5 refills | Status: DC

## 2022-09-28 MED ORDER — AZELASTINE HCL 0.1 % NA SOLN: 1.0000 | Freq: Every day | NASAL | 5 refills | Status: DC

## 2022-09-28 NOTE — Patient Instructions (Addendum)
Continue Symbicort 80 mcg 2 puffs twice daily with spacer. Rinse mouth out after use  Rest of plan as below: 1.  Continue Flonase -1 spray each nostril 1-2 times per day depending on disease activity  2.  Continue azelastine -1 spray each nostril 1-2 times per day depending on disease activity  3.  If needed:    A. OTC cetirizine 10 mg -1 tablet 1 time per day  B. Auvi-Q 0.3  4.  Albuterol inhaler- 2 puffs every 4 to 6 hours as needed.   5.  Return to clinic in 6 months or earlier if problem

## 2023-02-24 ENCOUNTER — Other Ambulatory Visit (HOSPITAL_COMMUNITY): Payer: Self-pay

## 2023-02-24 ENCOUNTER — Telehealth: Payer: Self-pay

## 2023-02-24 NOTE — Telephone Encounter (Signed)
 Pharmacy Patient Advocate Encounter  Received notification from [employer] that Prior Authorization for Budesonide -Formoterol  Fumarate 80-4.5MCG/ACT aerosol  has been APPROVED from 02/24/2023 to 02/24/2024. Ran test claim, Copay is $10.00. This test claim was processed through [facility]- copay amounts may vary at other pharmacies due to pharmacy/plan contracts, or as the patient moves through the different stages of their insurance plan.   PA #/Case ID/Reference #: [id number]    *patient pharmacy has been called and notified

## 2023-03-29 ENCOUNTER — Encounter: Payer: Self-pay | Admitting: Internal Medicine

## 2023-03-29 ENCOUNTER — Ambulatory Visit: Payer: Commercial Managed Care - PPO | Admitting: Internal Medicine

## 2023-03-29 ENCOUNTER — Other Ambulatory Visit: Payer: Self-pay

## 2023-03-29 VITALS — BP 122/86 | HR 81 | Temp 98.0°F | Resp 18 | Ht 65.0 in | Wt 186.8 lb

## 2023-03-29 DIAGNOSIS — J4520 Mild intermittent asthma, uncomplicated: Secondary | ICD-10-CM | POA: Diagnosis not present

## 2023-03-29 DIAGNOSIS — J3089 Other allergic rhinitis: Secondary | ICD-10-CM | POA: Diagnosis not present

## 2023-03-29 DIAGNOSIS — H1013 Acute atopic conjunctivitis, bilateral: Secondary | ICD-10-CM

## 2023-03-29 DIAGNOSIS — T7800XD Anaphylactic reaction due to unspecified food, subsequent encounter: Secondary | ICD-10-CM | POA: Diagnosis not present

## 2023-03-29 DIAGNOSIS — J302 Other seasonal allergic rhinitis: Secondary | ICD-10-CM

## 2023-03-29 MED ORDER — FLUTICASONE PROPIONATE 50 MCG/ACT NA SUSP: 1.0000 | Freq: Every day | NASAL | 5 refills | Status: DC

## 2023-03-29 MED ORDER — BUDESONIDE-FORMOTEROL FUMARATE 80-4.5 MCG/ACT IN AERO: 2.0000 | INHALATION_SPRAY | Freq: Two times a day (BID) | RESPIRATORY_TRACT | 5 refills | Status: DC

## 2023-03-29 MED ORDER — AZELASTINE HCL 0.1 % NA SOLN: 1.0000 | Freq: Every day | NASAL | 5 refills | Status: DC

## 2023-03-29 NOTE — Progress Notes (Signed)
 FOLLOW UP Date of Service/Encounter:  03/29/23  Subjective:  Stefanie Smith (DOB: 06/21/1990) is a 33 y.o. female who [facility] in re-evaluation of the following: Asthma, allergic rhinitis, food allergies, chronic urticaria History obtained from: chart review and patient.  For Review, LV was on 09/28/2022 with Dr.Tanecia Mccay seen for routine follow-up. See below for summary of history and diagnostics.  ----------------------------------------------------- Pertinent History/Diagnostics:  Asthma: Remote history.  Has a history of wheezing around cats.  Has not required albuterol  in years. At her visit on 08/24/2022-her asthma was acting up and we started Symbicort  82 puffs twice a day which she has continued since that visit. Allergic Rhinitis:  Perennial nasal congestion, rhinorrhea, sneezing, postnasal drainage, nasal pruritus, and ocular pruritus  - 2018 SPT Positive to grass pollens, weed pollens, ragweed pollen, tree pollens, molds, and cat hair. Intradermal testing: Positive to dog epithelia and dust mite antigen. Food Allergy : Tree nuts, coconut, sesame. Hx of reactions: consumed beef taco's with lettuce and cheese as well as white chocolate candy. Approximately 1.5 to 2 hours after the meal she developed hives all over, nausea, vomiting, diarrhea, and lightheadedness. Hypotensive in ED.  Avoids coconut, tree nuts, sesame following allergy  testing. 2018 SPT Positive to walnut, almond, hazelnut, coconut, and sesame seed.  Urticaria:  Occurs daily if not taking 1 antihistamine.  No obvious triggers --------------------------------------------------- Today presents for follow-up.  She reports that her asthma has been well-controlled and she has been asymptomatic since her last visit.  She continues on Symbicort  80 mcg 2 puffs twice daily, and has not needed to use her albuterol  inhaler.  She is required no prednisone or antibiotics since  last visit.  She does have nasal sprays to be used if needed for allergic rhinitis symptoms, but reports this is mainly bothersome during spring and fall.  She has not been on allergy  injections previously.  She continues to still have urticaria, but will notice it every once in a while at which time she will switch antihistamines and urticaria resolves.  She has successfully avoided coconut, tree nuts and sesame since her last visit without any accidental exposures.    All medications reviewed by clinical staff and updated in chart. No new pertinent medical or surgical history except as noted in HPI.  ROS: All others negative except as noted per HPI.   Objective:  BP 122/86   Pulse 81   Temp 98 F (36.7 C) (Temporal)   Resp 18   Ht 5' 5 (1.651 m)   Wt 186 lb 12.8 oz (84.7 kg)   SpO2 98%   BMI 31.09 kg/m  Body mass index is 31.09 kg/m. Physical Exam: General Appearance:  Alert, cooperative, no distress, appears stated age  Head:  Normocephalic, without obvious abnormality, atraumatic  Eyes:  Conjunctiva clear, EOM's intact  Ears EACs normal bilaterally and normal TMs bilaterally  Nose: Nares normal, hypertrophic turbinates, normal mucosa, no visible anterior polyps, and septum midline  Throat: Lips, tongue normal; teeth and gums normal, normal posterior oropharynx  Neck: Supple, symmetrical  Lungs:   clear to auscultation bilaterally, Respirations unlabored, no coughing  Heart:  regular rate and rhythm and no murmur, Appears well perfused  Extremities: No edema  Skin: Skin color, texture, turgor normal and no rashes or lesions on visualized portions of skin  Neurologic: No gross deficits   Labs:  Lab Orders  No laboratory test(s) ordered today    Spirometry:  Tracings reviewed. Her effort: Good  reproducible efforts. FVC: 3.40L FEV1: 3.05L, 94% predicted FEV1/FVC ratio: 107% Interpretation: Spirometry consistent with normal pattern.  Please see scanned spirometry  results for details.  Assessment/Plan   Continue Symbicort  80 mcg 2 puffs twice daily with spacer. Rinse mouth out after use  Rest of plan as below: 1.  Continue Flonase  -1 spray each nostril 1-2 times per day depending on disease activity  2.  Continue azelastine  -1 spray each nostril 1-2 times per day depending on disease activity  3.  If needed:    A. OTC Allegra 180 mg -1 tablet 1 time per day, can increase to twice daily if needed for hives  B. Auvi-Q  0.3  4.  Albuterol  inhaler- 2 puffs every 4 to 6 hours as needed.   5.  Return to clinic in 6 months or earlier if problem  Other: none  Rocky Endow, MD  [facility] 

## 2023-03-29 NOTE — Patient Instructions (Addendum)
 Continue Symbicort  80 mcg 2 puffs twice daily with spacer. Rinse mouth out after use  Rest of plan as below: 1.  Continue Flonase  -1 spray each nostril 1-2 times per day depending on disease activity  2.  Continue azelastine  -1 spray each nostril 1-2 times per day depending on disease activity  3.  If needed:    A. OTC Allegra 180 mg -1 tablet 1 time per day, can increase to twice daily if needed for hives  B. Auvi-Q  0.3  4.  Albuterol  inhaler- 2 puffs every 4 to 6 hours as needed.   5.  Return to clinic in 6 months or earlier if problem

## 2023-04-27 ENCOUNTER — Other Ambulatory Visit: Payer: Self-pay

## 2023-04-27 ENCOUNTER — Encounter: Payer: Self-pay | Admitting: Internal Medicine

## 2023-04-27 MED ORDER — ADVAIR HFA 115-21 MCG/ACT IN AERO: 2.0000 | INHALATION_SPRAY | Freq: Two times a day (BID) | RESPIRATORY_TRACT | 5 refills | Status: DC

## 2023-04-27 NOTE — Telephone Encounter (Signed)
 Looking at pts formulary it appears advair (diskus and hfa) and breo both are tier 1 and dulera is tier 2 which would you like to change pt too?

## 2023-05-02 ENCOUNTER — Other Ambulatory Visit (HOSPITAL_COMMUNITY): Payer: Self-pay

## 2023-05-02 ENCOUNTER — Telehealth: Payer: Self-pay

## 2023-05-02 NOTE — Telephone Encounter (Signed)
 Pharmacy states that last copay was $79.55/1 month, patient may need to contact plan as it should have been $10.00 per our test claims.

## 2023-05-02 NOTE — Telephone Encounter (Signed)
Lm for pt to call us back about this °

## 2023-05-02 NOTE — Telephone Encounter (Signed)
 Please clarify what inhaler patient is on- original order for Symbicort patient complained of price, we showed a $10.00 copay (not sure if the pharmacy used Brand or Generic)  Med has now been changed to Advair HFA- this is not a covered med under the patients plan.  CMM Key for Advair HFA: [id number]

## 2023-05-02 NOTE — Telephone Encounter (Signed)
 Pt called back, I explained the notes from [place] and pt verbalized understanding and will reach out to her plan.

## 2023-09-28 ENCOUNTER — Encounter: Payer: Self-pay | Admitting: Internal Medicine

## 2023-09-28 ENCOUNTER — Other Ambulatory Visit: Payer: Self-pay

## 2023-09-28 ENCOUNTER — Ambulatory Visit (INDEPENDENT_AMBULATORY_CARE_PROVIDER_SITE_OTHER): Admitting: Internal Medicine

## 2023-09-28 VITALS — BP 120/74 | HR 65 | Temp 98.0°F | Resp 18 | Ht 66.0 in | Wt 193.9 lb

## 2023-09-28 DIAGNOSIS — J302 Other seasonal allergic rhinitis: Secondary | ICD-10-CM

## 2023-09-28 DIAGNOSIS — J3089 Other allergic rhinitis: Secondary | ICD-10-CM

## 2023-09-28 DIAGNOSIS — H1013 Acute atopic conjunctivitis, bilateral: Secondary | ICD-10-CM

## 2023-09-28 DIAGNOSIS — L508 Other urticaria: Secondary | ICD-10-CM

## 2023-09-28 DIAGNOSIS — J4520 Mild intermittent asthma, uncomplicated: Secondary | ICD-10-CM | POA: Diagnosis not present

## 2023-09-28 DIAGNOSIS — T7800XD Anaphylactic reaction due to unspecified food, subsequent encounter: Secondary | ICD-10-CM | POA: Diagnosis not present

## 2023-09-28 DIAGNOSIS — F40298 Other specified phobia: Secondary | ICD-10-CM | POA: Insufficient documentation

## 2023-09-28 MED ORDER — BUDESONIDE-FORMOTEROL FUMARATE 80-4.5 MCG/ACT IN AERO: 2.0000 | INHALATION_SPRAY | Freq: Every day | RESPIRATORY_TRACT | 1 refills | Status: AC

## 2023-09-28 MED ORDER — ALBUTEROL SULFATE HFA 108 (90 BASE) MCG/ACT IN AERS: 2.0000 | INHALATION_SPRAY | RESPIRATORY_TRACT | 2 refills | Status: AC | PRN

## 2023-09-28 MED ORDER — AZELASTINE HCL 0.1 % NA SOLN: 1.0000 | Freq: Every day | NASAL | 5 refills | Status: AC

## 2023-09-28 MED ORDER — NEFFY 2 MG/0.1ML NA SOLN: 1.0000 | NASAL | 1 refills | Status: AC | PRN

## 2023-09-28 MED ORDER — FLUTICASONE PROPIONATE 50 MCG/ACT NA SUSP: 1.0000 | Freq: Every day | NASAL | 5 refills | Status: AC

## 2023-09-28 NOTE — Progress Notes (Signed)
 FOLLOW UP Date of Service/Encounter:   09/28/2023  Subjective:  Stefanie Smith (DOB: 04-30-1990) is a 33 y.o. female who [facility] in re-evaluation of the following:  Asthma, allergic rhinitis, food allergies, chronic urticaria  History obtained from: chart review and patient.  For Review, LV was on 03/29/23  with Dr.Bolivar Koranda seen for routine follow-up. See below for summary of history and diagnostics.   Therapeutic plans/changes recommended: Fev1 94%, doing well, no changes made ----------------------------------------------------- Pertinent History/Diagnostics:  Asthma: Remote history.  Has a history of wheezing around cats.  Has not required albuterol  in years. At her visit on 08/24/2022-her asthma was acting up and we started Symbicort  82 puffs twice a day which she has continued since that visit. Allergic Rhinitis:  Perennial nasal congestion, rhinorrhea, sneezing, postnasal drainage, nasal pruritus, and ocular pruritus  - 2018 SPT Positive to grass pollens, weed pollens, ragweed pollen, tree pollens, molds, and cat hair. Intradermal testing: Positive to dog epithelia and dust mite antigen. Food Allergy: Tree nuts, coconut, sesame. Hx of reactions: consumed beef taco's with lettuce and cheese as well as white chocolate candy. Approximately 1.5 to 2 hours after the meal she developed hives all over, nausea, vomiting, diarrhea, and lightheadedness. Hypotensive in ED.  Avoids coconut, tree nuts, sesame following allergy testing. 2018 SPT Positive to walnut, almond, hazelnut, coconut, and sesame seed.  Urticaria:  Occurs daily if not taking 1 antihistamine.  No obvious triggers --------------------------------------------------- Today presents for follow-up. Discussed the use of AI scribe software for clinical note transcription with the patient, who gave verbal consent to proceed.  History of Present Illness Stefanie Smith is a 33  year old female with asthma and recurrent hives who presents for follow-up on her asthma management and allergy symptoms.  Asthma symptoms and management - Currently managed with Symbicort , maintained at twice daily dosing - Recent switch to a 90-day supply due to insurance requirements, resolving previous coverage issues - No recent need for rescue inhaler - Breathing test results are satisfactory - Last use of albuterol  occurred in May 2025 after having to run through an airport to catch a flight - No requirement for systemic steroids or antibiotics in the past year - Asthma tends to flare with respiratory illnesses, but no recent respiratory infections reported - No recent attempts to reduce Symbicort  dosage  Recurrent urticaria - Experiences recurrent hives with variable frequency: sometimes absent for months, other times occurring multiple times per week - Uncertain triggers, with stress suspected as a possible factor - Regularly takes Allegra once daily for symptom control - Occasionally uses Benadryl  for severe hives, which causes drowsiness - Has not attempted to increase Allegra dosage during flare-ups  Allergen exposure and anaphylaxis preparedness - No recent exposure to tree nuts, coconuts, or sesame - Recently refilled Epipen  prescription -Reports needle phobia and would prefer nasal epinephrine  if available  Allergies - using nasal sprays as needed and Allegra most days -Interested in allergy injections to help reduce medication usage   All medications reviewed by clinical staff and updated in chart. No new pertinent medical or surgical history except as noted in HPI.  ROS: All others negative except as noted per HPI.   Objective:  BP 120/74 (BP Location: Left Arm, Patient Position: Sitting, Cuff Size: Normal)   Pulse 65   Temp 98 F (36.7 C) (Temporal)   Resp 18   Ht 5' 6 (1.676 m)   Wt 193 lb 14.4 oz (88 kg)  SpO2 98%   BMI 31.30 kg/m  Body mass index is  31.3 kg/m. Physical Exam: General Appearance:  Alert, cooperative, no distress, appears stated age  Head:  Normocephalic, without obvious abnormality, atraumatic  Eyes:  Conjunctiva clear, EOM's intact  Ears EACs normal bilaterally and normal TMs bilaterally  Nose: Nares normal, hypertrophic turbinates, normal mucosa, and no visible anterior polyps  Throat: Lips, tongue normal; teeth and gums normal, normal posterior oropharynx  Neck: Supple, symmetrical  Lungs:   clear to auscultation bilaterally, Respirations unlabored, no coughing  Heart:  regular rate and rhythm and no murmur, Appears well perfused  Extremities: No edema  Skin: Skin color, texture, turgor normal and no rashes or lesions on visualized portions of skin  Neurologic: No gross deficits   Labs:  Lab Orders  No laboratory test(s) ordered today    Spirometry:  Tracings reviewed. Her effort: Good reproducible efforts. FVC: 4.22L FEV1: 3.53L, 105% predicted FEV1/FVC ratio: 0.84 Interpretation: Spirometry consistent with normal pattern.  Please see scanned spirometry results for details.  Assessment/Plan   Continue Symbicort  80 mcg 2 puffs once daily with spacer. Rinse mouth out after use  - if you have a respiratory illness/asthma flares: increase Symbicort  2 puffs TWICE a day for 1-2 weeks or until you feel better.  Rest of plan as below: 1.  Continue Flonase  -1 spray each nostril 1-2 times per day depending on disease activity  2.  Continue azelastine  -1 spray each nostril 1-2 times per day depending on disease activity  3.  If needed:    A. OTC Allegra 180 mg -1 tablet 1 time per day, can increase to twice daily if needed for hives  B. Neffy -sent due to needle phobia  4.  Albuterol  inhaler- 2 puffs every 4 to 6 hours as needed.   5.  Return August 21st at 9 AM ( stop allegra 3 days prior and any antihistamines), call insurance with codes, will update testing for allergy injections. (1-55, coconut, tree  nuts, sesame)  Other: none  Rocky Endow, MD  [facility] 

## 2023-09-28 NOTE — Patient Instructions (Addendum)
 Continue Symbicort  80 mcg 2 puffs once daily with spacer. Rinse mouth out after use  - if you have a respiratory illness/asthma flares: increase Symbicort  2 puffs TWICE a day for 1-2 weeks or until you feel better.  Rest of plan as below: 1.  Continue Flonase  -1 spray each nostril 1-2 times per day depending on disease activity  2.  Continue azelastine  -1 spray each nostril 1-2 times per day depending on disease activity  3.  If needed:    A. OTC Allegra 180 mg -1 tablet 1 time per day, can increase to twice daily if needed for hives  B. Neffy -sent due to needle phobia  4.  Albuterol  inhaler- 2 puffs every 4 to 6 hours as needed.   5.  Return August 21st at 9 AM ( stop allegra 3 days prior and any antihistamines), call insurance with codes, will update testing for allergy injections. (1-55, coconut, tree nuts, sesame)

## 2023-10-12 ENCOUNTER — Encounter: Payer: Self-pay | Admitting: Internal Medicine

## 2023-10-12 ENCOUNTER — Ambulatory Visit (INDEPENDENT_AMBULATORY_CARE_PROVIDER_SITE_OTHER): Admitting: Internal Medicine

## 2023-10-12 DIAGNOSIS — J3089 Other allergic rhinitis: Secondary | ICD-10-CM | POA: Diagnosis not present

## 2023-10-12 DIAGNOSIS — T7800XD Anaphylactic reaction due to unspecified food, subsequent encounter: Secondary | ICD-10-CM | POA: Diagnosis not present

## 2023-10-12 DIAGNOSIS — L508 Other urticaria: Secondary | ICD-10-CM

## 2023-10-12 DIAGNOSIS — J302 Other seasonal allergic rhinitis: Secondary | ICD-10-CM

## 2023-10-12 NOTE — Patient Instructions (Addendum)
 Environmental allergy  10/12/2023 today was positive to grass pollen, weed pollen, tree pollen, outdoor molds, cat.  Intradermal's today were positive to mold mix for-minor molds.  Allergen avoidance. Skin testing 09/30/2023 for sesame, coconut and tree nuts were negative.  Will confirm with labs. --Consider allergy  injections to reduce lifetime symptoms and need for medications by teaching your immune system to become tolerant of the environmental allergens you are allergic to  Continue Symbicort  80 mcg 2 puffs once daily with spacer. Rinse mouth out after use  - if you have a respiratory illness/asthma flares: increase Symbicort  2 puffs TWICE a day for 1-2 weeks or until you feel better.  Rest of plan as below: 1.  Continue Flonase  -1 spray each nostril 1-2 times per day depending on disease activity  2.  Continue azelastine  -1 spray each nostril 1-2 times per day depending on disease activity  3.  If needed:    A. OTC Allegra 180 mg -1 tablet 1 time per day, can increase to twice daily if needed for hives  B. Neffy -sent due to needle phobia  4.  Albuterol  inhaler- 2 puffs every 4 to 6 hours as needed.   5.  6 months for follow-up [facility] for [facility] immunotherapy

## 2023-10-12 NOTE — Progress Notes (Signed)
 Date of Service/Encounter:  10/12/23  Allergy  testing appointment   Initial visit on 09/28/23, seen for asthma, allergic rhinitis, and food allergies.  Please see that note for additional details.  Today reports for allergy  diagnostic testing:    DIAGNOSTICS:  Skin Testing: Environmental allergy  panel. Adequate positive and negative controls. Results discussed with patient/family.  Airborne Adult Perc - 10/12/23 0858     Time Antigen Placed 0858    Allergen Manufacturer Jestine    Location Back    Number of Test 55    1. Control-Buffer 50% Glycerol 3+    2. Control-Histamine 3+    3. [place] 3+    4. [place] 3+    5. Johnson 3+    6. [place]  Blue 3+    7. Meadow Fescue 3+    8. Perennial Rye 3+    9. Timothy 3+    10. Ragweed Mix 3+    11. Cocklebur 3+    12. Plantain,  English Negative    13. Baccharis 3+    14. Dog Fennel Negative    15. Russian Thistle Negative    16. Lamb's Quarters Negative    17. Sheep Sorrell Negative    18. Rough Pigweed Negative    19. Marsh Elder, Rough Negative    20. Mugwort, Common 2+    21. Box, Elder 2+    22. Cedar, red Negative    23. Sweet Gum Negative    24. Pecan Pollen Negative    25. Pine Mix Negative    26. Walnut, Black Pollen 2+    27. Red Mulberry 2+    28. Ash Mix 2+    29. Birch Mix Negative    30. Beech American 2+    31. Cottonwood, [place] Negative    32. Hickory, White Negative    33. Maple Mix Negative    34. Oak, [place] Mix 3+    35. Sycamore Eastern Negative    36. Alternaria Alternata 3+    37. Cladosporium Herbarum Negative    38. Aspergillus Mix Negative    39. Penicillium Mix Negative    40. Bipolaris Sorokiniana (Helminthosporium) Negative    41. Drechslera Spicifera (Curvularia) Negative    42. Mucor Plumbeus Negative    43. Fusarium Moniliforme Negative    44. Aureobasidium Pullulans (pullulara) Negative    45. Rhizopus Oryzae Negative    46. Botrytis Cinera Negative    47. Epicoccum Nigrum  Negative    48. Phoma Betae Negative    49. Dust Mite Mix Negative    50. Cat Hair 10,000 BAU/ml 3+    51.  Dog Epithelia Negative    52. Mixed Feathers Negative    53. Horse Epithelia Negative    54. Cockroach, German Negative    55. Tobacco Leaf Negative    Comments n          Intradermal - 10/12/23 1011     Time Antigen Placed 1011    Allergen Manufacturer Greer    Location Arm    Number of Test 7    Control Negative    Mold 2 Negative    Mold 3 Negative    Mold 4 2+    Mite Mix Negative    Dog Negative    Cockroach Negative          Food Adult Perc - 10/12/23 0800     Time Antigen Placed 9141    Allergen Manufacturer Jestine    Location Back  Number of allergen test 9    4. Sesame Negative    10. Cashew Negative    11. Walnut Food Negative    12. Almond Negative    13. Hazelnut Negative    14. Pecan Food Negative    15. Pistachio Negative    16. [place] Nut Negative    17. Coconut Negative    Comments n          Allergy  testing results were read and interpreted by myself, documented by clinical staff.  Patient provided with copy of allergy  testing along with avoidance measures when indicated.   Rocky Endow, MD  [facility] 

## 2023-10-16 ENCOUNTER — Ambulatory Visit: Payer: Self-pay | Admitting: Internal Medicine

## 2023-10-16 LAB — IGE NUT PROF. W/COMPONENT RFLX
F017-IgE Hazelnut (Filbert): <0.1 kU/L
F018-IgE Brazil Nut: <0.1 kU/L
F020-IgE Almond: <0.1 kU/L
F202-IgE Cashew Nut: <0.1 kU/L
F203-IgE Pistachio Nut: <0.1 kU/L
F256-IgE Walnut: <0.1 kU/L
Macadamia Nut, IgE: <0.1 kU/L
Peanut, IgE: <0.1 kU/L
Pecan Nut IgE: <0.1 kU/L

## 2023-10-16 LAB — ALLERGEN COCONUT IGE: Allergen Coconut IgE: <0.1 kU/L

## 2023-10-16 LAB — ALLERGEN SESAME F10: Sesame Seed IgE: 0.16 kU/L — AB

## 2024-02-05 ENCOUNTER — Other Ambulatory Visit: Payer: Self-pay | Admitting: Gastroenterology

## 2024-02-05 ENCOUNTER — Encounter: Payer: Self-pay | Admitting: Gastroenterology

## 2024-02-05 DIAGNOSIS — R1032 Left lower quadrant pain: Secondary | ICD-10-CM

## 2024-02-13 ENCOUNTER — Ambulatory Visit
Admission: RE | Admit: 2024-02-13 | Discharge: 2024-02-13 | Disposition: A | Discharge status: Home / Self Care | Source: Ambulatory Visit | Attending: Gastroenterology | Admitting: Gastroenterology

## 2024-02-13 DIAGNOSIS — R1032 Left lower quadrant pain: Secondary | ICD-10-CM

## 2024-02-13 MED ORDER — IOPAMIDOL (ISOVUE-300) INJECTION 61%
100.0000 mL | Freq: Once | INTRAVENOUS | Status: AC | PRN
  Administered 2024-02-13: 100 mL via INTRAVENOUS
